# Patient Record
Sex: Female | Born: 1985 | Race: White | Hispanic: No | Smoking: Current every day smoker
Health system: Southern US, Community
[De-identification: ages and names within clinical notes are randomized; demographics above are authoritative.]

## PROBLEM LIST (undated history)

## (undated) DIAGNOSIS — F329 Major depressive disorder, single episode, unspecified: Secondary | ICD-10-CM

## (undated) DIAGNOSIS — IMO0002 Reserved for concepts with insufficient information to code with codable children: Secondary | ICD-10-CM

## (undated) DIAGNOSIS — F32A Depression, unspecified: Secondary | ICD-10-CM

## (undated) DIAGNOSIS — R87619 Unspecified abnormal cytological findings in specimens from cervix uteri: Secondary | ICD-10-CM

## (undated) DIAGNOSIS — Z8619 Personal history of other infectious and parasitic diseases: Secondary | ICD-10-CM

## (undated) DIAGNOSIS — F419 Anxiety disorder, unspecified: Secondary | ICD-10-CM

## (undated) HISTORY — DX: Depression, unspecified: F32.A

## (undated) HISTORY — DX: Reserved for concepts with insufficient information to code with codable children: IMO0002

## (undated) HISTORY — DX: Personal history of other infectious and parasitic diseases: Z86.19

## (undated) HISTORY — DX: Anxiety disorder, unspecified: F41.9

## (undated) HISTORY — PX: WISDOM TOOTH EXTRACTION: SHX21

## (undated) HISTORY — DX: Unspecified abnormal cytological findings in specimens from cervix uteri: R87.619

---
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
## 2008-07-05 ENCOUNTER — Other Ambulatory Visit: Admission: RE | Admit: 2008-07-05 | Discharge: 2008-07-05 | Payer: Self-pay | Admitting: Family Medicine

## 2009-03-06 ENCOUNTER — Emergency Department (HOSPITAL_COMMUNITY): Admission: EM | Admit: 2009-03-06 | Discharge: 2009-03-07 | Payer: Self-pay | Admitting: Emergency Medicine

## 2009-03-08 ENCOUNTER — Emergency Department (HOSPITAL_COMMUNITY): Admission: EM | Admit: 2009-03-08 | Discharge: 2009-03-08 | Payer: Self-pay | Admitting: Emergency Medicine

## 2009-07-22 IMAGING — CR DG HIP (WITH OR WITHOUT PELVIS) 2-3V*L*
2 series · 2 of 2 positions shown · non-contrast
Comparison: None available.

CLINICAL DATA: Fall from horse.  Pain.

LEFT HIP - COMPLETE 2+ VIEW

[t hip ap left]
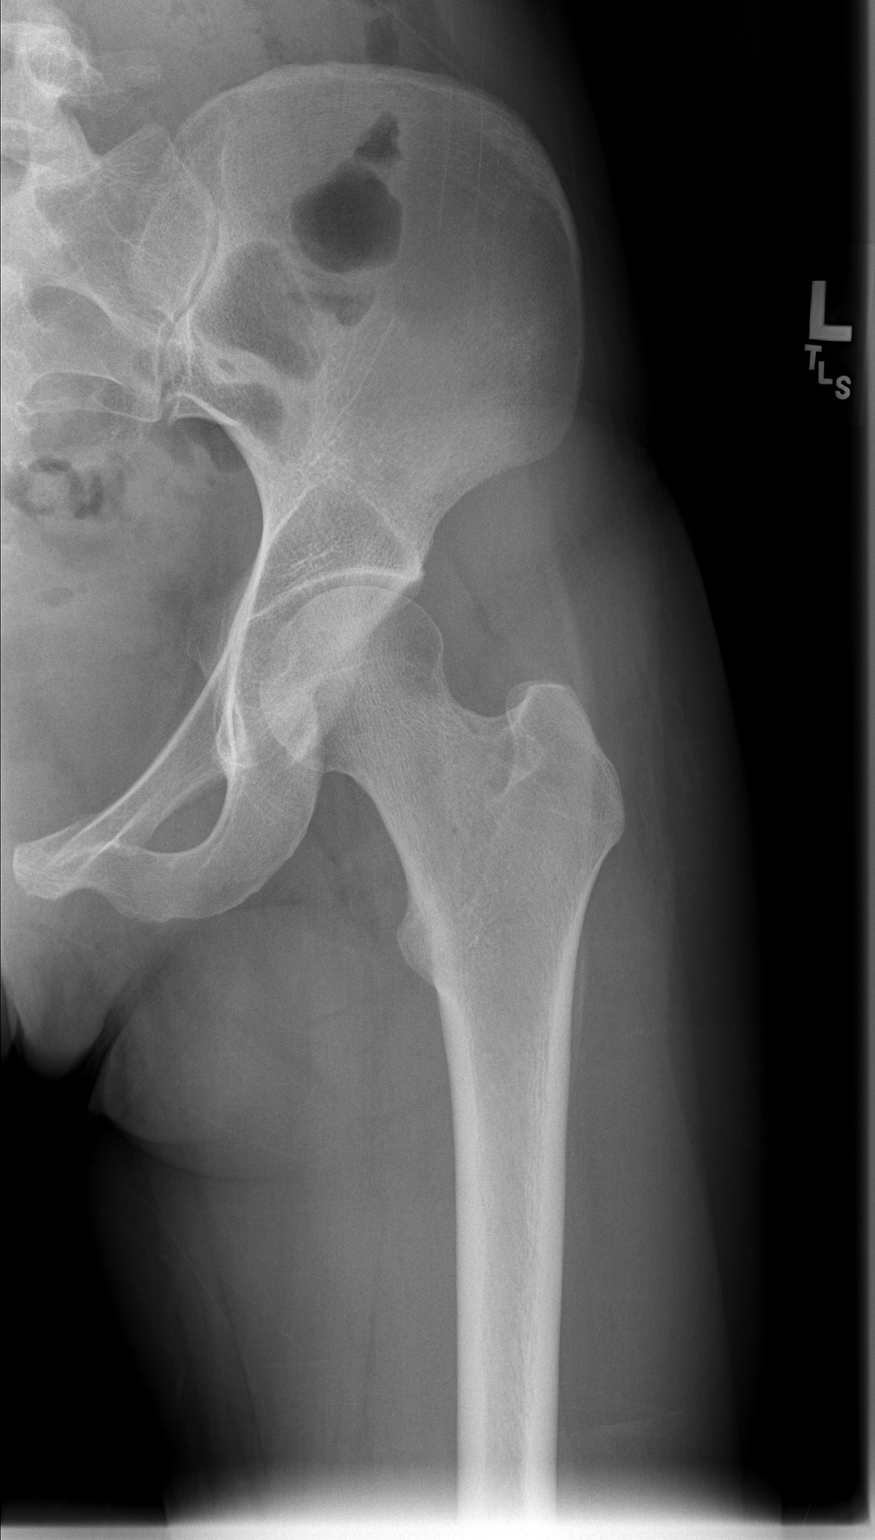

[t hip frog leg left]
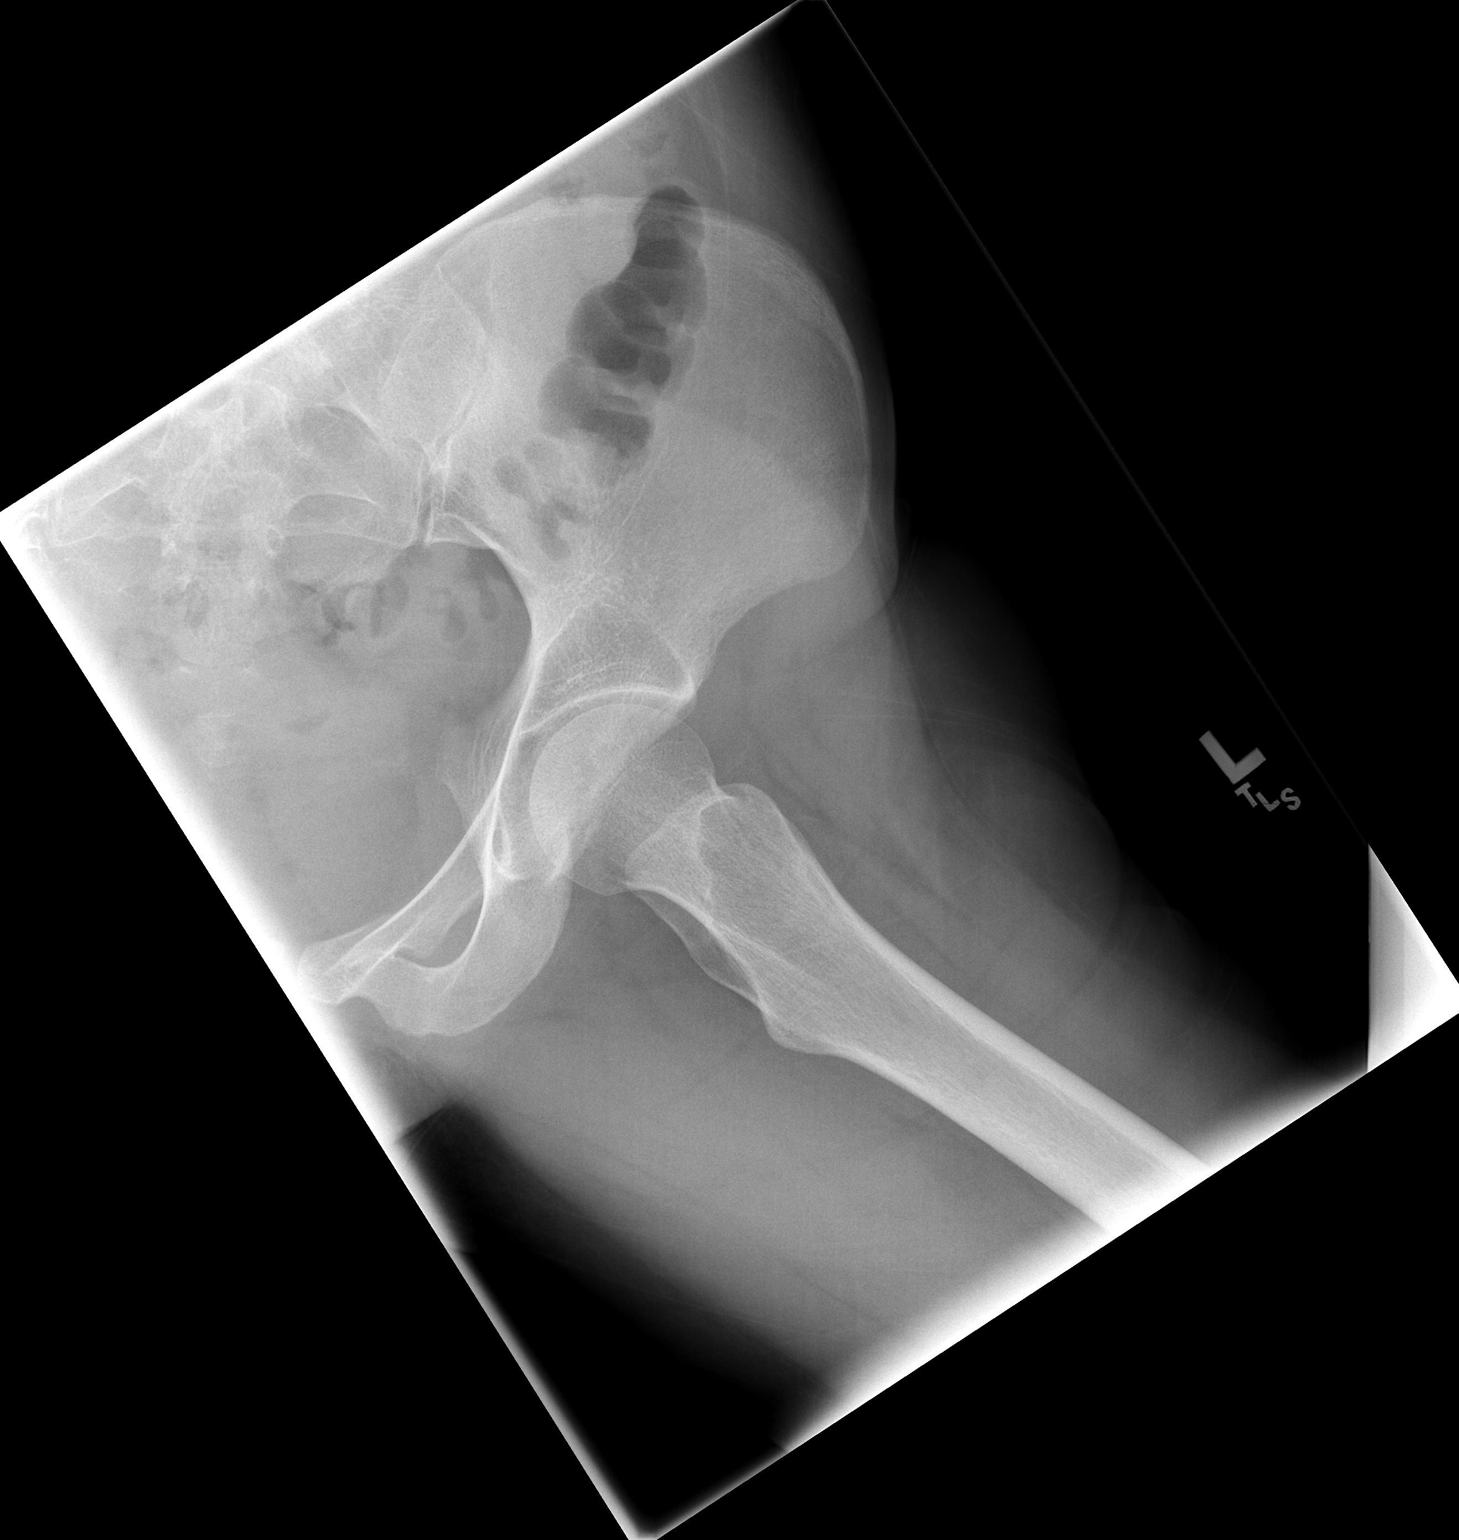

[2 of 2 positions shown; findings below may reference images not displayed]

FINDINGS: The left hip is located.  There is no fracture.  Soft
tissue structures unremarkable.
IMPRESSION: Negative exam.

## 2011-02-27 LAB — URINE MICROSCOPIC-ADD ON

## 2011-02-27 LAB — URINALYSIS, ROUTINE W REFLEX MICROSCOPIC
Bilirubin Urine: NEGATIVE
Glucose, UA: NEGATIVE mg/dL
Hgb urine dipstick: NEGATIVE
Hgb urine dipstick: NEGATIVE
Ketones, ur: NEGATIVE mg/dL
Nitrite: NEGATIVE
Nitrite: NEGATIVE
Protein, ur: NEGATIVE mg/dL
Specific Gravity, Urine: 1.012 (ref 1.005–1.030)
Specific Gravity, Urine: 1.031 — ABNORMAL HIGH (ref 1.005–1.030)
Urobilinogen, UA: 0.2 mg/dL (ref 0.0–1.0)
Urobilinogen, UA: 1 mg/dL (ref 0.0–1.0)
pH: 6.5 (ref 5.0–8.0)

## 2011-02-27 LAB — POCT PREGNANCY, URINE: Preg Test, Ur: NEGATIVE

## 2011-03-01 LAB — HEPATITIS B SURFACE ANTIGEN: Hepatitis B Surface Ag: NEGATIVE

## 2011-03-01 LAB — RUBELLA ANTIBODY, IGM: Rubella: IMMUNE

## 2011-03-01 LAB — ABO/RH: RH Type: POSITIVE

## 2011-04-05 ENCOUNTER — Other Ambulatory Visit (HOSPITAL_COMMUNITY): Payer: Self-pay | Admitting: Obstetrics and Gynecology

## 2011-04-05 DIAGNOSIS — Z3689 Encounter for other specified antenatal screening: Secondary | ICD-10-CM

## 2011-04-08 ENCOUNTER — Ambulatory Visit (HOSPITAL_COMMUNITY)
Admission: RE | Admit: 2011-04-08 | Discharge: 2011-04-08 | Disposition: A | Discharge status: Home / Self Care | Source: Ambulatory Visit | Attending: Obstetrics and Gynecology | Admitting: Obstetrics and Gynecology

## 2011-04-08 ENCOUNTER — Encounter (HOSPITAL_COMMUNITY): Payer: Self-pay

## 2011-04-08 ENCOUNTER — Other Ambulatory Visit (HOSPITAL_COMMUNITY): Payer: Self-pay | Admitting: Obstetrics and Gynecology

## 2011-04-08 DIAGNOSIS — O99330 Smoking (tobacco) complicating pregnancy, unspecified trimester: Secondary | ICD-10-CM | POA: Insufficient documentation

## 2011-04-08 DIAGNOSIS — Z363 Encounter for antenatal screening for malformations: Secondary | ICD-10-CM | POA: Insufficient documentation

## 2011-04-08 DIAGNOSIS — Z3689 Encounter for other specified antenatal screening: Secondary | ICD-10-CM

## 2011-04-08 DIAGNOSIS — O358XX0 Maternal care for other (suspected) fetal abnormality and damage, not applicable or unspecified: Secondary | ICD-10-CM | POA: Insufficient documentation

## 2011-04-08 DIAGNOSIS — Z1389 Encounter for screening for other disorder: Secondary | ICD-10-CM | POA: Insufficient documentation

## 2011-08-01 LAB — STREP B DNA PROBE: GBS: NEGATIVE

## 2011-08-30 ENCOUNTER — Telehealth (HOSPITAL_COMMUNITY): Payer: Self-pay | Admitting: *Deleted

## 2011-08-30 ENCOUNTER — Encounter (HOSPITAL_COMMUNITY): Payer: Self-pay | Admitting: *Deleted

## 2011-08-30 NOTE — Telephone Encounter (Signed)
Preadmission screen  

## 2011-09-02 ENCOUNTER — Other Ambulatory Visit: Payer: Self-pay | Admitting: Obstetrics and Gynecology

## 2011-09-02 ENCOUNTER — Inpatient Hospital Stay (HOSPITAL_COMMUNITY)
Admission: AD | Admit: 2011-09-02 | Discharge: 2011-09-06 | DRG: 765 | Disposition: A | Discharge status: Home / Self Care | Source: Ambulatory Visit | Attending: Obstetrics and Gynecology | Admitting: Obstetrics and Gynecology

## 2011-09-02 DIAGNOSIS — Z98891 History of uterine scar from previous surgery: Secondary | ICD-10-CM

## 2011-09-02 DIAGNOSIS — O324XX0 Maternal care for high head at term, not applicable or unspecified: Secondary | ICD-10-CM | POA: Diagnosis present

## 2011-09-02 NOTE — Progress Notes (Signed)
Pt states the contractions started every 5 min 2 hours ago-has been contracting all day

## 2011-09-03 ENCOUNTER — Inpatient Hospital Stay (HOSPITAL_COMMUNITY): Admitting: Anesthesiology

## 2011-09-03 ENCOUNTER — Other Ambulatory Visit: Payer: Self-pay | Admitting: Obstetrics and Gynecology

## 2011-09-03 ENCOUNTER — Encounter (HOSPITAL_COMMUNITY)
Admission: AD | Disposition: A | Discharge status: Home / Self Care | Payer: Self-pay | Source: Ambulatory Visit | Attending: Obstetrics and Gynecology

## 2011-09-03 ENCOUNTER — Inpatient Hospital Stay (HOSPITAL_COMMUNITY): Admission: RE | Admit: 2011-09-03 | Source: Ambulatory Visit

## 2011-09-03 ENCOUNTER — Encounter (HOSPITAL_COMMUNITY): Payer: Self-pay | Admitting: Anesthesiology

## 2011-09-03 LAB — CBC
MCH: 29.8 pg (ref 26.0–34.0)
MCHC: 33.8 g/dL (ref 30.0–36.0)
MCV: 88.2 fL (ref 78.0–100.0)
Platelets: 214 10*3/uL (ref 150–400)
RDW: 13.3 % (ref 11.5–15.5)

## 2011-09-03 SURGERY — Surgical Case
Anesthesia: Regional

## 2011-09-03 MED ORDER — LACTATED RINGERS IV SOLN
INTRAVENOUS | Status: DC
  Administered 2011-09-03 – 2011-09-04 (×7): via INTRAVENOUS

## 2011-09-03 MED ORDER — ONDANSETRON HCL 4 MG/2ML IJ SOLN
INTRAMUSCULAR | Status: AC
  Filled 2011-09-03: qty 2

## 2011-09-03 MED ORDER — EPHEDRINE 5 MG/ML INJ
10.0000 mg | INTRAVENOUS | Status: DC | PRN
  Filled 2011-09-03 (×2): qty 4

## 2011-09-03 MED ORDER — FENTANYL 2.5 MCG/ML BUPIVACAINE 1/10 % EPIDURAL INFUSION (WH - ANES)
14.0000 mL/h | INTRAMUSCULAR | Status: DC
  Administered 2011-09-03 (×4): 14 mL/h via EPIDURAL
  Filled 2011-09-03 (×4): qty 60

## 2011-09-03 MED ORDER — PHENYLEPHRINE HCL 10 MG/ML IJ SOLN
INTRAMUSCULAR | Status: DC | PRN
  Administered 2011-09-03 (×3): 40 ug via INTRAVENOUS

## 2011-09-03 MED ORDER — LACTATED RINGERS IV SOLN: 500.0000 mL | Freq: Once | INTRAVENOUS | Status: DC

## 2011-09-03 MED ORDER — OXYTOCIN 10 UNIT/ML IJ SOLN
INTRAMUSCULAR | Status: AC
  Filled 2011-09-03: qty 2

## 2011-09-03 MED ORDER — PHENYLEPHRINE 40 MCG/ML (10ML) SYRINGE FOR IV PUSH (FOR BLOOD PRESSURE SUPPORT)
PREFILLED_SYRINGE | INTRAVENOUS | Status: AC
  Filled 2011-09-03: qty 5

## 2011-09-03 MED ORDER — SODIUM BICARBONATE 8.4 % IV SOLN
INTRAVENOUS | Status: DC | PRN
  Administered 2011-09-03: 5 mL via EPIDURAL

## 2011-09-03 MED ORDER — OXYCODONE-ACETAMINOPHEN 5-325 MG PO TABS: 2.0000 | ORAL_TABLET | ORAL | Status: DC | PRN

## 2011-09-03 MED ORDER — DIPHENHYDRAMINE HCL 50 MG/ML IJ SOLN: 12.5000 mg | INTRAMUSCULAR | Status: DC | PRN

## 2011-09-03 MED ORDER — LACTATED RINGERS IV SOLN: 500.0000 mL | INTRAVENOUS | Status: DC | PRN

## 2011-09-03 MED ORDER — OXYTOCIN 20 UNITS IN LACTATED RINGERS INFUSION - SIMPLE: 125.0000 mL/h | Freq: Once | INTRAVENOUS | Status: DC

## 2011-09-03 MED ORDER — FLEET ENEMA 7-19 GM/118ML RE ENEM: 1.0000 | ENEMA | RECTAL | Status: DC | PRN

## 2011-09-03 MED ORDER — EPHEDRINE 5 MG/ML INJ
INTRAVENOUS | Status: AC
  Filled 2011-09-03: qty 10

## 2011-09-03 MED ORDER — ONDANSETRON HCL 4 MG/2ML IJ SOLN
4.0000 mg | Freq: Four times a day (QID) | INTRAMUSCULAR | Status: DC | PRN
  Administered 2011-09-03 (×2): 4 mg via INTRAVENOUS
  Filled 2011-09-03 (×2): qty 2

## 2011-09-03 MED ORDER — OXYTOCIN 20 UNITS IN LACTATED RINGERS INFUSION - SIMPLE
INTRAVENOUS | Status: DC | PRN
  Administered 2011-09-03 – 2011-09-04 (×2): 20 [IU] via INTRAVENOUS

## 2011-09-03 MED ORDER — LIDOCAINE HCL (PF) 1 % IJ SOLN
30.0000 mL | INTRAMUSCULAR | Status: DC | PRN
  Filled 2011-09-03: qty 30

## 2011-09-03 MED ORDER — FENTANYL CITRATE 0.05 MG/ML IJ SOLN
INTRAMUSCULAR | Status: DC | PRN
  Administered 2011-09-03: 100 ug via INTRAVENOUS

## 2011-09-03 MED ORDER — IBUPROFEN 600 MG PO TABS: 600.0000 mg | ORAL_TABLET | Freq: Four times a day (QID) | ORAL | Status: DC | PRN

## 2011-09-03 MED ORDER — EPHEDRINE 5 MG/ML INJ
10.0000 mg | INTRAVENOUS | Status: DC | PRN
  Filled 2011-09-03: qty 4

## 2011-09-03 MED ORDER — SODIUM CHLORIDE 0.9 % IV SOLN
3.0000 g | Freq: Four times a day (QID) | INTRAVENOUS | Status: DC
  Administered 2011-09-03 – 2011-09-05 (×6): 3 g via INTRAVENOUS
  Filled 2011-09-03 (×8): qty 3

## 2011-09-03 MED ORDER — ACETAMINOPHEN 325 MG PO TABS: 650.0000 mg | ORAL_TABLET | ORAL | Status: DC | PRN

## 2011-09-03 MED ORDER — MORPHINE SULFATE 0.5 MG/ML IJ SOLN
INTRAMUSCULAR | Status: AC
  Filled 2011-09-03: qty 10

## 2011-09-03 MED ORDER — ACETAMINOPHEN 500 MG PO TABS
1000.0000 mg | ORAL_TABLET | ORAL | Status: DC | PRN
  Administered 2011-09-03: 1000 mg via ORAL
  Filled 2011-09-03: qty 2

## 2011-09-03 MED ORDER — OXYTOCIN 20 UNITS IN LACTATED RINGERS INFUSION - SIMPLE
1.0000 m[IU]/min | INTRAVENOUS | Status: DC
  Administered 2011-09-03: 2 m[IU]/min via INTRAVENOUS

## 2011-09-03 MED ORDER — CITRIC ACID-SODIUM CITRATE 334-500 MG/5ML PO SOLN
30.0000 mL | ORAL | Status: DC | PRN
  Administered 2011-09-03: 30 mL via ORAL
  Filled 2011-09-03: qty 15

## 2011-09-03 MED ORDER — OXYTOCIN BOLUS FROM INFUSION
500.0000 mL | Freq: Once | INTRAVENOUS | Status: DC
  Filled 2011-09-03: qty 500
  Filled 2011-09-03: qty 1000

## 2011-09-03 MED ORDER — MORPHINE SULFATE (PF) 0.5 MG/ML IJ SOLN
INTRAMUSCULAR | Status: DC | PRN
  Administered 2011-09-03: 2 mg via INTRAVENOUS

## 2011-09-03 MED ORDER — LIDOCAINE HCL 1.5 % IJ SOLN
INTRAMUSCULAR | Status: DC | PRN
  Administered 2011-09-03 (×2): 5 mL via INTRADERMAL

## 2011-09-03 MED ORDER — MORPHINE SULFATE (PF) 0.5 MG/ML IJ SOLN
INTRAMUSCULAR | Status: DC | PRN
  Administered 2011-09-03: 3 mg via EPIDURAL

## 2011-09-03 MED ORDER — PHENYLEPHRINE 40 MCG/ML (10ML) SYRINGE FOR IV PUSH (FOR BLOOD PRESSURE SUPPORT)
80.0000 ug | PREFILLED_SYRINGE | INTRAVENOUS | Status: DC | PRN
  Filled 2011-09-03 (×2): qty 5

## 2011-09-03 MED ORDER — TERBUTALINE SULFATE 1 MG/ML IJ SOLN: 0.2500 mg | Freq: Once | INTRAMUSCULAR | Status: AC | PRN

## 2011-09-03 MED ORDER — FENTANYL CITRATE 0.05 MG/ML IJ SOLN
INTRAMUSCULAR | Status: AC
  Filled 2011-09-03: qty 2

## 2011-09-03 MED ORDER — ONDANSETRON HCL 4 MG/2ML IJ SOLN
INTRAMUSCULAR | Status: DC | PRN
  Administered 2011-09-03: 4 mg via INTRAVENOUS

## 2011-09-03 MED ORDER — PHENYLEPHRINE 40 MCG/ML (10ML) SYRINGE FOR IV PUSH (FOR BLOOD PRESSURE SUPPORT)
80.0000 ug | PREFILLED_SYRINGE | INTRAVENOUS | Status: DC | PRN
  Filled 2011-09-03: qty 5

## 2011-09-03 SURGICAL SUPPLY — 30 items
CHLORAPREP W/TINT 26ML (MISCELLANEOUS) ×2 IMPLANT
CLOTH BEACON ORANGE TIMEOUT ST (SAFETY) ×2 IMPLANT
CONTAINER PREFILL 10% NBF 15ML (MISCELLANEOUS) IMPLANT
DRSG COVADERM 4X10 (GAUZE/BANDAGES/DRESSINGS) ×2 IMPLANT
ELECT REM PT RETURN 9FT ADLT (ELECTROSURGICAL) ×2
ELECTRODE REM PT RTRN 9FT ADLT (ELECTROSURGICAL) ×1 IMPLANT
EXTRACTOR VACUUM KIWI (MISCELLANEOUS) IMPLANT
EXTRACTOR VACUUM M CUP 4 TUBE (SUCTIONS) IMPLANT
GLOVE BIO SURGEON STRL SZ 6.5 (GLOVE) ×4 IMPLANT
GLOVE BIO SURGEON STRL SZ8 (GLOVE) ×2 IMPLANT
GOWN PREVENTION PLUS LG XLONG (DISPOSABLE) ×4 IMPLANT
KIT ABG SYR 3ML LUER SLIP (SYRINGE) IMPLANT
NEEDLE HYPO 25X5/8 SAFETYGLIDE (NEEDLE) ×2 IMPLANT
NS IRRIG 1000ML POUR BTL (IV SOLUTION) ×2 IMPLANT
PACK C SECTION WH (CUSTOM PROCEDURE TRAY) ×2 IMPLANT
RTRCTR C-SECT PINK 25CM LRG (MISCELLANEOUS) ×2 IMPLANT
SLEEVE SCD COMPRESS KNEE MED (MISCELLANEOUS) IMPLANT
STAPLER VISISTAT 35W (STAPLE) IMPLANT
STRIP CLOSURE SKIN 1/2X4 (GAUZE/BANDAGES/DRESSINGS) ×2 IMPLANT
SUT CHROMIC 1 CTX 36 (SUTURE) ×4 IMPLANT
SUT PLAIN 0 NONE (SUTURE) IMPLANT
SUT PLAIN 2 0 XLH (SUTURE) IMPLANT
SUT VIC AB 0 CT1 27 (SUTURE) ×2
SUT VIC AB 0 CT1 27XBRD ANBCTR (SUTURE) ×2 IMPLANT
SUT VIC AB 2-0 CT1 27 (SUTURE) ×1
SUT VIC AB 2-0 CT1 TAPERPNT 27 (SUTURE) ×1 IMPLANT
SUT VIC AB 4-0 KS 27 (SUTURE) ×2 IMPLANT
TOWEL OR 17X24 6PK STRL BLUE (TOWEL DISPOSABLE) ×4 IMPLANT
TRAY FOLEY CATH 14FR (SET/KITS/TRAYS/PACK) IMPLANT
WATER STERILE IRR 1000ML POUR (IV SOLUTION) ×2 IMPLANT

## 2011-09-03 NOTE — Progress Notes (Signed)
   Subjective: Pt still comfortable  Objective: BP 119/79  Pulse 105  Temp(Src) 100.1 F (37.8 C) (Axillary)  Resp 20  Ht 5\' 7"  (1.702 m)  Wt 70.761 kg (156 lb)  BMI 24.43 kg/m2  SpO2 99%  LMP 11/29/2010      FHT:  FHR: 180 bpm, variability: moderate,  accelerations:  Present,  decelerations:  Absent UC:   regular, every 1-2 minutes SVE:c/c/+1 Pt now with temp to 102, FHR TACHYCARDIC TO 180 BUT GOOD VARIABILITY.  Will give tylenol 1 gram and start unasyn 3grams IV q 6 hours.  Has been pushing 30 minutes again.  Assessment / Plan: [name] 09/03/2011, 9:15 PM

## 2011-09-03 NOTE — Progress Notes (Signed)
[name] is a 25 y.o. G1P0 at [redacted]w[redacted]d  Subjective: Having intermittent N/V  Objective: BP 120/78  Pulse 78  Temp(Src) 98.5 F (36.9 C) (Oral)  Resp 20  Ht 5\' 7"  (1.702 m)  Wt 70.761 kg (156 lb)  BMI 24.43 kg/m2  SpO2 99%  LMP 11/29/2010      FHT:  FHR: 130 bpm, variability: moderate,  accelerations:  Present,  decelerations:  Present mild early decels UC:   regular, every 2 minutes SVE:   Dilation: Lip/rim Effacement (%): 80 Station: 0 Exam by:: Clearence Cheek RN  Labs: Lab Results  Component Value Date   WBC 11.0* 09/03/2011   HGB 11.6* 09/03/2011   HCT 34.3* 09/03/2011   MCV 88.2 09/03/2011   PLT 214 09/03/2011    Assessment / Plan: Feels slightly asynclitic, will try exaggerated Sims position Wonder Donaway W 09/03/2011, 5:36 PM

## 2011-09-03 NOTE — Anesthesia Procedure Notes (Signed)
Epidural Patient location during procedure: OB Start time: 09/03/2011 9:22 AM  Staffing Performed by: anesthesiologist   Preanesthetic Checklist Completed: patient identified, site marked, surgical consent, pre-op evaluation, timeout performed, IV checked, risks and benefits discussed and monitors and equipment checked  Epidural Patient position: sitting Prep: site prepped and draped and DuraPrep Patient monitoring: continuous pulse ox and blood pressure Approach: midline Injection technique: LOR air and LOR saline  Needle:  Needle type: Tuohy  Needle gauge: 17 G Needle length: 9 cm Needle insertion depth: 5 cm cm Catheter type: closed end flexible Catheter size: 19 Gauge Catheter at skin depth: 10 cm Test dose: negative  Assessment Events: blood not aspirated, injection not painful, no injection resistance, negative IV test and no paresthesia  Additional Notes Patient identified.  Risk benefits discussed including failed block, incomplete pain control, headache, nerve damage, paralysis, blood pressure changes, nausea, vomiting, reactions to medication both toxic or allergic, and postpartum back pain.  Patient expressed understanding and wished to proceed.  All questions were answered.  Sterile technique used throughout procedure and epidural site dressed with sterile barrier dressing. No paresthesia or other complications noted.The patient did not experience any signs of intravascular injection such as tinnitus or metallic taste in mouth nor signs of intrathecal spread such as rapid motor block. Please see nursing notes for vital signs.

## 2011-09-03 NOTE — Progress Notes (Signed)
    Pt still comfortable, feels pressure with ctx  Pt has been pushing with good effort for about 2 hours and has only progressed vertex to a +1 station.  D/w her slow progress and recommend proceeding with c-section.   Discussed risks and benefits as well as option of continued pushing.  FHR now at baseline 160's with good variability and temp decreasing with tylenol, but not resolved.  Pt and husband desire to proceed with C-Section. OR notified.  WIll go when room available.  Pt has received Unasyn.  Oliver Pila 09/03/2011, 10:34 PM

## 2011-09-03 NOTE — Progress Notes (Signed)
SIDONIA NUTTER is a 25 y.o. G1P0 at [redacted]w[redacted]d   Subjective: Pt comfortable with epidural  Objective: BP 124/78  Pulse 111  Temp(Src) 98.6 F (37 C) (Oral)  Resp 20  Ht 5\' 7"  (1.702 m)  Wt 70.761 kg (156 lb)  BMI 24.43 kg/m2  SpO2 99%  LMP 11/29/2010      FHT:  FHR: 130 bpm, variability: moderate,  accelerations:  Present,  decelerations:  Absent UC:   regular, every 2-3 minutes SVE:   Dilation: 6.5 Effacement (%): 80 Station: -1 Exam by:: Dr. Senaida Ores  Labs: Lab Results  Component Value Date   WBC 11.0* 09/03/2011   HGB 11.6* 09/03/2011   HCT 34.3* 09/03/2011   MCV 88.2 09/03/2011   PLT 214 09/03/2011    Assessment / Plan: Augmentation of labor, progressing well   Zakiya Sporrer W 09/03/2011, 12:24 PM

## 2011-09-03 NOTE — Anesthesia Preprocedure Evaluation (Addendum)
Anesthesia Evaluation  Name, MR# and DOB Patient awake  General Assessment Comment  Reviewed: Allergy & Precautions, H&P , Patient's Chart, lab work & pertinent test results  Airway Mallampati: II TM Distance: >3 FB Neck ROM: full    Dental No notable dental hx.    Pulmonary  clear to auscultation  Pulmonary exam normal       Cardiovascular regular Normal    Neuro/Psych Negative Neurological ROS  Negative Psych ROS   GI/Hepatic negative GI ROS Neg liver ROS    Endo/Other  Negative Endocrine ROS  Renal/GU negative Renal ROS     Musculoskeletal   Abdominal   Peds  Hematology negative hematology ROS (+)   Anesthesia Other Findings   Reproductive/Obstetrics (+) Pregnancy                           Anesthesia Physical Anesthesia Plan  ASA: II and Emergent  Anesthesia Plan: Epidural   Post-op Pain Management:    Induction:   Airway Management Planned:   Additional Equipment:   Intra-op Plan:   Post-operative Plan:   Informed Consent: I have reviewed the patients History and Physical, chart, labs and discussed the procedure including the risks, benefits and alternatives for the proposed anesthesia with the patient or authorized representative who has indicated his/her understanding and acceptance.     Plan Discussed with: Anesthesiologist, CRNA and Surgeon  Anesthesia Plan Comments:        Anesthesia Quick Evaluation

## 2011-09-03 NOTE — H&P (Signed)
[name] is a 25 y.o. G1P0 at 72 1/7 weeks (EDD 09/02/11 by 10 week [place])  female presenting for contractions at about 1-2 AM and was scheduled for induction anyway, so kept overnight, but really did not progress any, so started on Pitocin to augment labor.  Prenatal care significant only for h/o anxiety/fibromyalgia but has been stable this pregnancy on minimal meds.  (occasional flexeril and klonipin)  No other significant issues.  Maternal Medical History:  Reason for admission: Reason for admission: contractions.  Contractions: Onset was 6-12 hours ago.   Frequency: irregular.   Perceived severity is moderate.    Fetal activity: Perceived fetal activity is normal.      OB History    Grav Para Term Preterm Abortions TAB SAB Ect Mult Living   1              Past Medical History  Diagnosis Date  . Abnormal Pap smear   . Anxiety   . History of HPV infection    Past Surgical History  Procedure Date  . Laser ablation of the cervix 2004   Family History: family history includes Diabetes in her maternal grandfather and maternal grandmother and Hypertension in her maternal grandmother.  There is no history of Anesthesia problems, and Hypotension, and Malignant hyperthermia, and Pseudochol deficiency, . Social History:  reports that she quit smoking about 7 months ago. She does not have any smokeless tobacco history on file. She reports that she does not drink alcohol or use illicit drugs.  ROS  Dilation: 4 Effacement (%): 80 Station: -1 Exam by:: Dr. Senaida Ores AROM slightly bloody Blood pressure 125/78, pulse 91, temperature 98.4 F (36.9 C), temperature source Oral, resp. rate 20, height 5\' 7"  (1.702 m), weight 70.761 kg (156 lb), last menstrual period 11/29/2010. Maternal Exam:  Uterine Assessment: Contraction strength is moderate.  Abdomen: Patient reports no abdominal tenderness. Fetal presentation: vertex  Introitus: Normal vulva. Normal vagina.    Physical Exam    Constitutional: She is oriented to person, place, and time. She appears well-developed.  Cardiovascular: Normal rate and regular rhythm.   Respiratory: Effort normal and breath sounds normal.  GI: Soft. Bowel sounds are normal.  Genitourinary: Vagina normal and uterus normal.  Neurological: She is alert and oriented to person, place, and time.  Psychiatric: She has a normal mood and affect. Her behavior is normal.    Prenatal labs: ABO, Rh: B/Positive/-- (04/13 0000) Antibody: Negative (04/13 0000) Rubella: Immune (04/13 0000) RPR: NON REACTIVE (10/16 0240)  HBsAg: Negative (04/13 0000)  HIV: Non-reactive (04/13 0000)  GBS: Negative (09/13 0000)  One hour glucola 76 First trimester screen nml AFP WNL Pap LGSIL colpo WNL repeat pp  Assessment/Plan: Pt getting uncomfortable and requesting epidural, will allow.  Pitocin per protocol.   Oliver Pila 09/03/2011, 8:49 AM

## 2011-09-03 NOTE — Progress Notes (Signed)
   Subjective: Pt very comfortable  Objective: BP 122/105  Pulse 151  Temp(Src) 99.7 F (37.6 C) (Oral)  Resp 20  Ht 5\' 7"  (1.702 m)  Wt 70.761 kg (156 lb)  BMI 24.43 kg/m2  SpO2 99%  LMP 11/29/2010      FHT:  FHR: 125 bpm, variability: moderate,  accelerations:  Present,  decelerations:  Absent UC:   regular, every 2-3 minutes SVE:   Dilation: 10 Effacement (%): 100 Station: 0 Exam by:: Dr Senaida Ores Pt attempted pushing for about 30 minutes and made slow progress.  Feels nothing.  Will wait about 1 hour and then resume unless pt becomes uncomfortable prior.  [name] 09/03/2011, 7:58 PM

## 2011-09-04 ENCOUNTER — Encounter (HOSPITAL_COMMUNITY): Payer: Self-pay | Admitting: *Deleted

## 2011-09-04 MED ORDER — NALBUPHINE HCL 10 MG/ML IJ SOLN
5.0000 mg | INTRAMUSCULAR | Status: DC | PRN
Start: 2011-09-04 — End: 2011-09-06
  Filled 2011-09-04: qty 1

## 2011-09-04 MED ORDER — DIPHENHYDRAMINE HCL 50 MG/ML IJ SOLN: 25.0000 mg | INTRAMUSCULAR | Status: DC | PRN

## 2011-09-04 MED ORDER — MEPERIDINE HCL 25 MG/ML IJ SOLN
INTRAMUSCULAR | Status: AC
  Filled 2011-09-04: qty 1

## 2011-09-04 MED ORDER — HYDROMORPHONE HCL 1 MG/ML IJ SOLN: 0.5000 mg | INTRAMUSCULAR | Status: DC | PRN

## 2011-09-04 MED ORDER — ZOLPIDEM TARTRATE 5 MG PO TABS: 5.0000 mg | ORAL_TABLET | Freq: Every evening | ORAL | Status: DC | PRN

## 2011-09-04 MED ORDER — KETOROLAC TROMETHAMINE 30 MG/ML IJ SOLN
INTRAMUSCULAR | Status: AC
  Filled 2011-09-04: qty 1

## 2011-09-04 MED ORDER — DIPHENHYDRAMINE HCL 25 MG PO CAPS: 25.0000 mg | ORAL_CAPSULE | Freq: Four times a day (QID) | ORAL | Status: DC | PRN

## 2011-09-04 MED ORDER — SCOPOLAMINE 1 MG/3DAYS TD PT72
MEDICATED_PATCH | TRANSDERMAL | Status: AC
  Filled 2011-09-04: qty 1

## 2011-09-04 MED ORDER — SODIUM CHLORIDE 0.9 % IJ SOLN
3.0000 mL | INTRAMUSCULAR | Status: DC | PRN
  Administered 2011-09-04: 3 mL via INTRAVENOUS

## 2011-09-04 MED ORDER — IBUPROFEN 600 MG PO TABS
600.0000 mg | ORAL_TABLET | Freq: Four times a day (QID) | ORAL | Status: DC
  Administered 2011-09-04 – 2011-09-06 (×8): 600 mg via ORAL
  Filled 2011-09-04 (×5): qty 1

## 2011-09-04 MED ORDER — DIBUCAINE 1 % RE OINT: 1.0000 "application " | TOPICAL_OINTMENT | RECTAL | Status: DC | PRN

## 2011-09-04 MED ORDER — KETOROLAC TROMETHAMINE 30 MG/ML IJ SOLN
30.0000 mg | Freq: Four times a day (QID) | INTRAMUSCULAR | Status: AC | PRN
  Administered 2011-09-04 (×2): 30 mg via INTRAVENOUS
  Filled 2011-09-04: qty 1

## 2011-09-04 MED ORDER — METOCLOPRAMIDE HCL 5 MG/ML IJ SOLN: 10.0000 mg | Freq: Three times a day (TID) | INTRAMUSCULAR | Status: DC | PRN

## 2011-09-04 MED ORDER — ONDANSETRON HCL 4 MG PO TABS: 4.0000 mg | ORAL_TABLET | ORAL | Status: DC | PRN

## 2011-09-04 MED ORDER — DIPHENHYDRAMINE HCL 25 MG PO CAPS: 25.0000 mg | ORAL_CAPSULE | ORAL | Status: DC | PRN

## 2011-09-04 MED ORDER — NALBUPHINE HCL 10 MG/ML IJ SOLN
5.0000 mg | INTRAMUSCULAR | Status: DC | PRN
  Filled 2011-09-04: qty 1

## 2011-09-04 MED ORDER — SCOPOLAMINE 1 MG/3DAYS TD PT72
1.0000 | MEDICATED_PATCH | Freq: Once | TRANSDERMAL | Status: DC
  Administered 2011-09-04: 1.5 mg via TRANSDERMAL
  Filled 2011-09-04: qty 1

## 2011-09-04 MED ORDER — MEPERIDINE HCL 25 MG/ML IJ SOLN
INTRAMUSCULAR | Status: DC | PRN
  Administered 2011-09-04: 25 mg via INTRAVENOUS

## 2011-09-04 MED ORDER — SODIUM CHLORIDE 0.9 % IV SOLN
1.0000 ug/kg/h | INTRAVENOUS | Status: DC | PRN
  Filled 2011-09-04: qty 2.5

## 2011-09-04 MED ORDER — ONDANSETRON HCL 4 MG/2ML IJ SOLN: 4.0000 mg | INTRAMUSCULAR | Status: DC | PRN

## 2011-09-04 MED ORDER — KETOROLAC TROMETHAMINE 30 MG/ML IJ SOLN: 30.0000 mg | Freq: Four times a day (QID) | INTRAMUSCULAR | Status: AC | PRN

## 2011-09-04 MED ORDER — HYDROMORPHONE HCL 1 MG/ML IJ SOLN
INTRAMUSCULAR | Status: AC
  Filled 2011-09-04: qty 1

## 2011-09-04 MED ORDER — WITCH HAZEL-GLYCERIN EX PADS: 1.0000 "application " | MEDICATED_PAD | CUTANEOUS | Status: DC | PRN

## 2011-09-04 MED ORDER — SIMETHICONE 80 MG PO CHEW
80.0000 mg | CHEWABLE_TABLET | Freq: Three times a day (TID) | ORAL | Status: DC
  Administered 2011-09-04 – 2011-09-05 (×6): 80 mg via ORAL

## 2011-09-04 MED ORDER — DIPHENHYDRAMINE HCL 25 MG PO CAPS
ORAL_CAPSULE | ORAL | Status: AC
  Filled 2011-09-04: qty 1

## 2011-09-04 MED ORDER — MAGNESIUM HYDROXIDE 400 MG/5ML PO SUSP: 30.0000 mL | ORAL | Status: DC | PRN

## 2011-09-04 MED ORDER — NALOXONE HCL 0.4 MG/ML IJ SOLN: 0.4000 mg | INTRAMUSCULAR | Status: DC | PRN

## 2011-09-04 MED ORDER — LACTATED RINGERS IV SOLN: INTRAVENOUS | Status: DC

## 2011-09-04 MED ORDER — ONDANSETRON HCL 4 MG/2ML IJ SOLN: 4.0000 mg | Freq: Three times a day (TID) | INTRAMUSCULAR | Status: DC | PRN

## 2011-09-04 MED ORDER — DIPHENHYDRAMINE HCL 50 MG/ML IJ SOLN: 12.5000 mg | INTRAMUSCULAR | Status: DC | PRN

## 2011-09-04 MED ORDER — OXYCODONE-ACETAMINOPHEN 5-325 MG PO TABS
1.0000 | ORAL_TABLET | ORAL | Status: DC | PRN
  Administered 2011-09-04 – 2011-09-06 (×5): 1 via ORAL
  Administered 2011-09-06: 2 via ORAL
  Filled 2011-09-04 (×5): qty 1
  Filled 2011-09-04: qty 2
  Filled 2011-09-04: qty 1

## 2011-09-04 MED ORDER — SENNOSIDES-DOCUSATE SODIUM 8.6-50 MG PO TABS
2.0000 | ORAL_TABLET | Freq: Every day | ORAL | Status: DC
  Administered 2011-09-04 – 2011-09-05 (×2): 2 via ORAL

## 2011-09-04 MED ORDER — SIMETHICONE 80 MG PO CHEW: 80.0000 mg | CHEWABLE_TABLET | ORAL | Status: DC | PRN

## 2011-09-04 NOTE — Progress Notes (Signed)

## 2011-09-04 NOTE — Anesthesia Postprocedure Evaluation (Signed)
  Anesthesia Post-op Note  Patient: [name]  Procedure(s) Performed:  CESAREAN SECTION  Patient Location: PACU and Mother/Baby  Anesthesia Type: Epidural  Level of Consciousness: awake, alert  and oriented  Airway and Oxygen Therapy: Patient Spontanous Breathing  Post-op Pain: none  Post-op Assessment: Post-op Vital signs reviewed  Post-op Vital Signs: Reviewed and stable  Complications: No apparent anesthesia complications

## 2011-09-04 NOTE — Addendum Note (Signed)
Addendum  created 09/04/11 0981 by Isabella Bowens   Modules edited:Notes Section

## 2011-09-04 NOTE — Progress Notes (Signed)
Subjective: Postpartum Day 1: Cesarean Delivery Patient reports tolerating PO, pain controlled  Objective: Vital signs in last 24 hours: Temp:  [98.1 F (36.7 C)-102.1 F (38.9 C)] 98.3 F (36.8 C) (10/17 0623) Pulse Rate:  [63-168] 71  (10/17 0623) Resp:  [16-22] 18  (10/17 0623) BP: (99-149)/(47-114) 121/74 mmHg (10/17 0623) SpO2:  [81 %-100 %] 100 % (10/17 0700)  Physical Exam:  General: alert [name]: appropriate Uterine Fundus: firm Incision: C/D/I }   Basename 09/03/11 0240  HGB 11.6*  HCT 34.3*    Assessment/Plan: Status post Cesarean section. Fever defervescing, will continue Unasyn for 24 hours.  Continue current care.  Oliver Pila 09/04/2011, 8:50 AM

## 2011-09-04 NOTE — Transfer of Care (Signed)
Immediate Anesthesia Transfer of Care Note  Patient: [name]  Procedure(s) Performed:  CESAREAN SECTION  Patient Location: PACU  Anesthesia Type: Epidural  Level of Consciousness: awake, alert  and oriented  Airway & Oxygen Therapy: Patient Spontanous Breathing  Post-op Assessment: Report given to PACU RN and Post -op Vital signs reviewed and stable  Post vital signs: Reviewed and stable  Complications: No apparent anesthesia complications

## 2011-09-04 NOTE — Op Note (Signed)
[name], [name] NO.:  [id number]  MEDICAL RECORD NO.:  [id number]  LOCATION:  [facility]                          FACILITY:  [facility]  PHYSICIAN:  Huel Cote, M.D. DATE OF BIRTH:  04-23-1986  DATE OF PROCEDURE: DATE OF DISCHARGE:                              OPERATIVE REPORT   PREOPERATIVE DIAGNOSES:  Arrest of descent, term pregnancy at 40+ weeks, and meconium-stained fluid.  POSTOPERATIVE DIAGNOSES:  Arrest of descent, term pregnancy at 40+ weeks, and meconium-stained fluid.  PROCEDURE:  Primary low-transverse C-section with two-layer closure of uterus.  SURGEON:  Huel Cote, MD  ANESTHESIA:  Epidural.  ESTIMATED BLOOD LOSS:  600 mL.  URINE OUTPUT:  300 mL blood-tinged urine.  IV FLUIDS:  1400 mL LR.  FINDINGS:  There was a viable female infant in the vertex presentation, Apgars were 4 and 8, weight was 8 pounds 1 ounce.  There was terminal meconium noted that was thick after delivery of the infant.  NICU was present to assess the baby and did note that there was no meconium below the vocal cords.  Uterus, tubes, and ovaries were normal.  SPECIMEN:  Placenta was sent to Pathology.  PROCEDURE IN DETAIL:  After informed consent was obtained, the patient was taken to the operating room where epidural anesthesia was found to be adequate by Allis clamp test.  She was then prepped and draped in the normal sterile fashion in the dorsal supine position with a leftward tilt.  An appropriate time-out was performed and a Pfannenstiel skin incision was then made with a scalpel and carried through to the underlying layer of fascia by sharp dissection and Bovie cautery.  The fascia was then nicked in the midline and the incision was extended laterally.  The inferior aspect of the incision was grasped with Kocher clamps, elevated, and dissected off underlying rectus muscles.  In a similar fashion, the superior aspect was dissected off the rectus muscles.   These were separated in the midline and the peritoneal cavity entered bluntly.  The peritoneal incision was then extended both superiorly and inferiorly with careful attention to avoid both bowel and bladder.  The Alexis self-retaining wound retractor was then placed within the incision and the lower uterine segment was exposed.  The bladder flap was created and the lower uterine segment was then incised in a transverse fashion.  The cavity itself was entered bluntly with thick meconium-stained fluid noted.  The infant's head was then elevated out of the pelvis and delivered atraumatically.  Nose and mouth were bulb suctioned, and the remainder of the infant delivered without difficulty.  The infant's cord was clamped and cut and handed off to the awaiting pediatricians to assess the baby.  The placenta was then expressed spontaneously after cord blood donation was performed.  The uterus was cleared of all clots and debris and noted to be slightly boggy, therefore the Pitocin bolus was increased.  This responded well to massage and the uterine incision was then closed in 2 layers, the first a running locked layer of 1-0 chromic and the second an imbricating layer of the same suture.  Good hemostasis was noted.  The pelvis and abdomen were irrigated  with no areas of active bleeding noted.  The bladder flap was inspected and no obvious trauma noted.  The instruments and sponges were then removed from the patient's abdomen and the peritoneum was reapproximated with several interrupted mattress sutures of 2-0 Vicryl.  The fascia was closed with 0 Vicryl in a running fashion and the skin was closed with 3-0 Vicryl in a subcuticular stitch on a Keith needle.  Benzoin and Steri-Strips were placed.  Again all instruments, sponges were removed from the abdomen and counts were correct and the patient was taken to the recovery room in good condition.     Huel Cote, M.D.     KR/MEDQ   D:  09/04/2011  T:  09/04/2011  Job:  [id number]

## 2011-09-04 NOTE — Anesthesia Postprocedure Evaluation (Signed)
  Anesthesia Post-op Note  Patient: [name]  Procedure(s) Performed:  CESAREAN SECTION  Patient Location: PACU  Anesthesia Type: Epidural  Level of Consciousness: awake, alert  and oriented  Airway and Oxygen Therapy: Patient Spontanous Breathing  Post-op Pain: mild  Post-op Assessment: Post-op Vital signs reviewed, Patient's Cardiovascular Status Stable, Respiratory Function Stable, Patent Airway, No signs of Nausea or vomiting, Pain level controlled, No headache and No backache  Post-op Vital Signs: Reviewed and stable  Complications: No apparent anesthesia complications

## 2011-09-05 LAB — CBC
HCT: 28.4 % — ABNORMAL LOW (ref 36.0–46.0)
Hemoglobin: 9.4 g/dL — ABNORMAL LOW (ref 12.0–15.0)
RBC: 3.15 MIL/uL — ABNORMAL LOW (ref 3.87–5.11)

## 2011-09-05 MED ORDER — FENTANYL CITRATE 0.05 MG/ML IJ SOLN: 25.0000 ug | INTRAMUSCULAR | Status: DC | PRN

## 2011-09-05 MED ORDER — METOCLOPRAMIDE HCL 5 MG/ML IJ SOLN: 10.0000 mg | Freq: Once | INTRAMUSCULAR | Status: AC | PRN

## 2011-09-05 MED ORDER — MEPERIDINE HCL 25 MG/ML IJ SOLN: 6.2500 mg | INTRAMUSCULAR | Status: DC | PRN

## 2011-09-05 MED ORDER — IBUPROFEN 600 MG PO TABS
600.0000 mg | ORAL_TABLET | Freq: Four times a day (QID) | ORAL | Status: DC | PRN
  Filled 2011-09-05 (×4): qty 1

## 2011-09-05 NOTE — Progress Notes (Signed)
Subjective: Postpartum Day 2: Cesarean Delivery Patient reports tolerating PO and no problems voiding.    Objective: Vital signs in last 24 hours: Temp:  [97.8 F (36.6 C)-98.3 F (36.8 C)] 97.8 F (36.6 C) (10/18 0815) Pulse Rate:  [66-92] 78  (10/18 0508) Resp:  [16-20] 18  (10/18 0508) BP: (104-125)/(66-74) 104/69 mmHg (10/18 0508) SpO2:  [98 %-100 %] 100 % (10/17 2231)  Physical Exam:  General: alert [name]: appropriate Uterine Fundus: firm Incision: healing well    Basename 09/03/11 0240  HGB 11.6*  HCT 34.3*    Assessment/Plan: Status post Cesarean section. Doing well postoperatively.  No temp in 24 hours, will d/c unasyn. Continue current care. D/w pt and husband circumcision in detail, risks and benfits reviewed--desire me to proceed. [name] 09/05/2011, 8:47 AM

## 2011-09-06 ENCOUNTER — Encounter (HOSPITAL_COMMUNITY): Payer: Self-pay | Admitting: Obstetrics and Gynecology

## 2011-09-06 MED ORDER — IBUPROFEN 600 MG PO TABS: 600.0000 mg | ORAL_TABLET | Freq: Four times a day (QID) | ORAL | Status: AC | PRN

## 2011-09-06 MED ORDER — OXYCODONE-ACETAMINOPHEN 5-325 MG PO TABS: 1.0000 | ORAL_TABLET | ORAL | Status: AC | PRN

## 2011-09-06 NOTE — Discharge Summary (Signed)
Obstetric Discharge Summary Reason for Admission: onset of labor Prenatal Procedures: none Intrapartum Procedures: cesarean: low cervical, transverse Postpartum Procedures: none Complications-Operative and Postpartum: none Hemoglobin  Date Value Range Status  09/05/2011 9.4* 12.0-15.0 (g/dL) Final     HCT  Date Value Range Status  09/05/2011 28.4* 36.0-46.0 (%) Final    Discharge Diagnoses: Term Pregnancy-delivered  Discharge Information: Date: 09/06/2011 Activity: pelvic rest Diet: routine Medications: Ibuprofen and Percocet Condition: stable Instructions: refer to practice specific booklet Discharge to: home   Newborn Data: Live born female  Birth Weight: 8 lb 1.8 oz (3680 g) APGAR: 4, 8  Home with mother.  [name] 09/06/2011, 10:35 AM

## 2011-09-06 NOTE — Progress Notes (Signed)
Subjective: [name] reports tolerating PO and no problems voiding.  She has some blurry vision in her right eye only, but no other c/o  Objective: Vital signs in last 24 hours: Temp:  [98 F (36.7 C)-98.4 F (36.9 C)] 98 F (36.7 C) (10/19 0500) Pulse Rate:  [59-64] 59  (10/19 0500) Resp:  [18] 18  (10/19 0500) BP: (113-120)/(72-76) 113/72 mmHg (10/19 0500) SpO2:  [98 %] 98 % (10/18 2115)  Physical Exam:  General: alert [name]: appropriate Uterine Fundus: firm Incision: healing well    Basename 09/05/11 0900  HGB 9.4*  HCT 28.4*    Assessment/Plan: Status post Cesarean section. Doing well postoperatively.  Discharge home with standard precautions and return to office in 2 weeks for incision check.  [name] 09/06/2011, 10:33 AM

## 2013-11-18 HISTORY — PX: BREAST ENHANCEMENT SURGERY: SHX7

## 2014-03-31 ENCOUNTER — Encounter (HOSPITAL_COMMUNITY): Payer: Self-pay | Admitting: Pharmacist

## 2014-04-03 NOTE — H&P (Signed)
[name] is an 28 y.o. female G2P1001 at 9 weeks by LMP with an unfortunate finding of a missed abortion on her OB workup last week.  Pt had an LMP of 01/21/14 and a positive UPT on 02/21/14.  She came for her initial US on 03/28/14 and was found to have a failed pregnancy with 2 gestational sacs.  One was empty and the other had a 7 week fetal pole with no heartbeat. She had had no siginificant bleeding or cramping.  She initially thought she would try cytotec, but changed her mind and has decided to have a D&E.  Pertinent Gynecological History: OB History: C/S 2012 8#1oz   Menstrual History:  No LMP recorded.    Past Medical History  Diagnosis Date  . Abnormal Pap smear   . Anxiety   . History of HPV infection     Past Surgical History  Procedure Laterality Date  . Laser ablation of the cervix  2004  . Cesarean section  09/03/2011    Procedure: CESAREAN SECTION;  Surgeon: Oliver PilaKathy W Hilberto Burzynski;  Location: [facility];  Service: Gynecology;  Laterality: N/A;    Family History  Problem Relation Age of Onset  . Anesthesia problems Neg Hx   . Hypotension Neg Hx   . Malignant hyperthermia Neg Hx   . Pseudochol deficiency Neg Hx   . Diabetes Maternal Grandmother   . Hypertension Maternal Grandmother   . Diabetes Maternal Grandfather     Social History:  reports that she quit smoking about 3 years ago. She does not have any smokeless tobacco history on file. She reports that she does not drink alcohol or use illicit drugs.  Allergies: No Known Allergies  No prescriptions prior to admission    ROS  unknown if currently breastfeeding. Physical Exam  Constitutional: She is oriented to person, place, and time. She appears well-developed and well-nourished.  Cardiovascular: Normal rate and regular rhythm.   Respiratory: Effort normal.  GI: Soft.  Genitourinary: Vagina normal and uterus normal.  Neurological: She is alert and oriented to person, place, and time.  Psychiatric: She  has a normal mood and affect.    No results found for this or any previous visit (from the past 24 hour(s)).  No results found.  Assessment/Plan: The findings of missed abortion were discussed with the patient in detail. We discussed her options going forward with expectant management, cytotec induced bleeding, and D&C. The risks and benefits of each approach were reviewed and the patient initially elected to try cytotec.  She the changed her mind and decided she would like a D&E.  THe risks of D&E were discussed with the patient and the procedure reviewed thoroughly.  We discussed risks of bleeding and uterine perforation.  Her blood type is B positive. She will pretreat with cytotec 400mcg 3 hours prior to the procedure.  Oliver PilaKathy W Britt Theard 04/03/2014, 8:30 AM

## 2014-04-04 ENCOUNTER — Ambulatory Visit (HOSPITAL_COMMUNITY)
Admission: RE | Admit: 2014-04-04 | Discharge: 2014-04-04 | Disposition: A | Discharge status: Home / Self Care | Source: Ambulatory Visit | Attending: Obstetrics and Gynecology | Admitting: Obstetrics and Gynecology

## 2014-04-04 ENCOUNTER — Ambulatory Visit (HOSPITAL_COMMUNITY): Admitting: Anesthesiology

## 2014-04-04 ENCOUNTER — Encounter (HOSPITAL_COMMUNITY)
Admission: RE | Disposition: A | Discharge status: Home / Self Care | Payer: Self-pay | Source: Ambulatory Visit | Attending: Obstetrics and Gynecology

## 2014-04-04 ENCOUNTER — Encounter (HOSPITAL_COMMUNITY): Admitting: Anesthesiology

## 2014-04-04 ENCOUNTER — Encounter (HOSPITAL_COMMUNITY): Payer: Self-pay | Admitting: *Deleted

## 2014-04-04 DIAGNOSIS — O021 Missed abortion: Secondary | ICD-10-CM | POA: Diagnosis present

## 2014-04-04 DIAGNOSIS — Z87891 Personal history of nicotine dependence: Secondary | ICD-10-CM | POA: Diagnosis not present

## 2014-04-04 HISTORY — PX: DILATION AND CURETTAGE OF UTERUS: SHX78

## 2014-04-04 LAB — CBC
HCT: 38.7 % (ref 36.0–46.0)
HEMOGLOBIN: 13.4 g/dL (ref 12.0–15.0)
MCH: 30.9 pg (ref 26.0–34.0)
MCHC: 34.6 g/dL (ref 30.0–36.0)
MCV: 89.2 fL (ref 78.0–100.0)
Platelets: 188 10*3/uL (ref 150–400)
RBC: 4.34 MIL/uL (ref 3.87–5.11)
RDW: 12.4 % (ref 11.5–15.5)
WBC: 5.3 10*3/uL (ref 4.0–10.5)

## 2014-04-04 SURGERY — DILATION AND CURETTAGE
Anesthesia: Monitor Anesthesia Care | Site: Vagina

## 2014-04-04 MED ORDER — LIDOCAINE HCL 1 % IJ SOLN
INTRAMUSCULAR | Status: DC | PRN
  Administered 2014-04-04: 20 mL

## 2014-04-04 MED ORDER — ONDANSETRON HCL 4 MG/2ML IJ SOLN
INTRAMUSCULAR | Status: AC
  Filled 2014-04-04: qty 2

## 2014-04-04 MED ORDER — MIDAZOLAM HCL 2 MG/2ML IJ SOLN
INTRAMUSCULAR | Status: AC
  Filled 2014-04-04: qty 2

## 2014-04-04 MED ORDER — LIDOCAINE HCL 1 % IJ SOLN
INTRAMUSCULAR | Status: AC
  Filled 2014-04-04: qty 20

## 2014-04-04 MED ORDER — FENTANYL CITRATE 0.05 MG/ML IJ SOLN
INTRAMUSCULAR | Status: AC
  Filled 2014-04-04: qty 2

## 2014-04-04 MED ORDER — LACTATED RINGERS IV SOLN
INTRAVENOUS | Status: DC
  Administered 2014-04-04: 13:00:00 via INTRAVENOUS

## 2014-04-04 MED ORDER — LACTATED RINGERS IV SOLN
INTRAVENOUS | Status: DC
Start: 2014-04-04 — End: 2014-04-04

## 2014-04-04 MED ORDER — PROPOFOL 10 MG/ML IV BOLUS
INTRAVENOUS | Status: DC | PRN
  Administered 2014-04-04 (×3): 30 mg via INTRAVENOUS
  Administered 2014-04-04: 50 mg via INTRAVENOUS
  Administered 2014-04-04 (×2): 30 mg via INTRAVENOUS

## 2014-04-04 MED ORDER — FENTANYL CITRATE 0.05 MG/ML IJ SOLN
INTRAMUSCULAR | Status: DC | PRN
Start: 2014-04-04 — End: 2014-04-04
  Administered 2014-04-04 (×2): 50 ug via INTRAVENOUS

## 2014-04-04 MED ORDER — DEXAMETHASONE SODIUM PHOSPHATE 10 MG/ML IJ SOLN
INTRAMUSCULAR | Status: AC
  Filled 2014-04-04: qty 1

## 2014-04-04 MED ORDER — LIDOCAINE HCL (CARDIAC) 20 MG/ML IV SOLN
INTRAVENOUS | Status: DC | PRN
  Administered 2014-04-04: 80 mg via INTRAVENOUS

## 2014-04-04 MED ORDER — PROPOFOL 10 MG/ML IV EMUL
INTRAVENOUS | Status: AC
  Filled 2014-04-04: qty 20

## 2014-04-04 MED ORDER — MIDAZOLAM HCL 2 MG/2ML IJ SOLN
INTRAMUSCULAR | Status: DC | PRN
  Administered 2014-04-04: 2 mg via INTRAVENOUS

## 2014-04-04 MED ORDER — KETOROLAC TROMETHAMINE 30 MG/ML IJ SOLN
INTRAMUSCULAR | Status: AC
  Filled 2014-04-04: qty 1

## 2014-04-04 MED ORDER — LIDOCAINE HCL (CARDIAC) 20 MG/ML IV SOLN
INTRAVENOUS | Status: AC
  Filled 2014-04-04: qty 5

## 2014-04-04 MED ORDER — ONDANSETRON HCL 4 MG/2ML IJ SOLN
INTRAMUSCULAR | Status: DC | PRN
  Administered 2014-04-04: 4 mg via INTRAVENOUS

## 2014-04-04 MED ORDER — KETOROLAC TROMETHAMINE 30 MG/ML IJ SOLN
INTRAMUSCULAR | Status: DC | PRN
  Administered 2014-04-04: 30 mg via INTRAVENOUS

## 2014-04-04 SURGICAL SUPPLY — 14 items
CATH ROBINSON RED A/P 16FR (CATHETERS) ×3 IMPLANT
CLOTH BEACON ORANGE TIMEOUT ST (SAFETY) ×3 IMPLANT
CONTAINER PREFILL 10% NBF 60ML (FORM) ×6 IMPLANT
DRSG TELFA 3X8 NADH (GAUZE/BANDAGES/DRESSINGS) ×3 IMPLANT
GLOVE BIO SURGEON STRL SZ 6.5 (GLOVE) ×2 IMPLANT
GLOVE BIO SURGEONS STRL SZ 6.5 (GLOVE) ×1
GOWN STRL REUS W/TWL LRG LVL3 (GOWN DISPOSABLE) ×6 IMPLANT
NEEDLE SPNL 22GX3.5 QUINCKE BK (NEEDLE) ×3 IMPLANT
PACK VAGINAL MINOR WOMEN LF (CUSTOM PROCEDURE TRAY) ×3 IMPLANT
PAD OB MATERNITY 4.3X12.25 (PERSONAL CARE ITEMS) ×3 IMPLANT
PAD PREP 24X48 CUFFED NSTRL (MISCELLANEOUS) ×3 IMPLANT
SYR CONTROL 10ML LL (SYRINGE) ×3 IMPLANT
TOWEL OR 17X24 6PK STRL BLUE (TOWEL DISPOSABLE) ×6 IMPLANT
WATER STERILE IRR 1000ML POUR (IV SOLUTION) ×3 IMPLANT

## 2014-04-04 NOTE — Discharge Instructions (Signed)
DISCHARGE INSTRUCTIONS: D&C The following instructions have been prepared to help you care for yourself upon your return home.   **You may begin to take Ibuprofen after 8:45 pm tonight*  Personal hygiene:  Use sanitary pads for vaginal drainage, not tampons.  Shower the day after your procedure.  NO tub baths, pools or Jacuzzis for 2-3 weeks.  Wipe front to back after using the bathroom. Nothing in the vagina for 2 weeks  Activity and limitations:  Do NOT drive or operate any equipment for 24 hours. The effects of anesthesia are still present and drowsiness may result.  Do NOT rest in bed all day.  Walking is encouraged.  Walk up and down stairs slowly.  You may resume your normal activity in one to two days or as indicated by your physician.  Sexual activity: NO intercourse for at least 2 weeks after the procedure, or as indicated by your physician.  Diet: Eat a light meal as desired this evening. You may resume your usual diet tomorrow.  Return to work: You may resume your work activities in one to two days or as indicated by your doctor.  What to expect after your surgery: Expect to have vaginal bleeding/discharge for 2-3 days and spotting for up to 10 days. It is not unusual to have soreness for up to 1-2 weeks. You may have a slight burning sensation when you urinate for the first day. Mild cramps may continue for a couple of days. You may have a regular period in 2-6 weeks.  Call your doctor for any of the following:  Excessive vaginal bleeding, saturating and changing one pad every hour.  Inability to urinate 6 hours after discharge from hospital.  Pain not relieved by pain medication.  Fever of 100.4 F or greater.  Unusual vaginal discharge or odor.   Call for an appointment: to be seen by Dr Senaida Oresichardson in 2 weeks   Patients signature: ______________________  Nurses signature ________________________  Support person's  signature_______________________

## 2014-04-04 NOTE — OR Nursing (Signed)
Pt is latex sensitive. Latex free catheter and gloves used by OR staff

## 2014-04-04 NOTE — Op Note (Signed)
Operative note  Preoperative diagnosis Missed abortion at 339 weeks gestational age  Postoperative diagnosis Same  Procedure Suction D&E  Surgeon Dr. Huel CoteKathy Leotis Isham  Anesthesia LMA  Fluids Estimated blood loss 50 cc Urine output 50 cc straight catheter prior to procedure IV fluid 800 cc LR  Findings The uterus measured approximately [redacted] weeks gestational size and sounded to 7-8 cm. There were a moderate amount of products of conception obtained  Specimen Products of conception sent to pathology  Procedure note Patient was taken to the operating room where LMA anesthesia was obtained without difficulty. She was then prepped and draped in the normal sterile fashion in the dorsal lithotomy position. An appropriate time out was performed. Cervix was injected on the anterior lip with approximately 2 cc of 1% lidocaine plain and additional 9 cc each was placed at 2 and 10:00 for a paracervical block. The uterus was then easily sounded do to her prior Cytotec use. A size 7 suction curette was obtained. This was easily introduced into the uterine fundus and on several passes a moderate amount of products of conception were obtained. When no further tissue was forthcoming the suction was discontinued and a curettage performed of the uterine cavity. No additional tissue was noted. One further pass with the suction curet was performed and no active bleeding noted at were any further tissue obtained. At this point all instruments and sponges were removed from the vagina the cervix was closely inspected and found to be hemostatic at the tenaculum site. The patient was then awakened and taken to the recovery room in good condition.

## 2014-04-04 NOTE — Anesthesia Postprocedure Evaluation (Signed)
  Anesthesia Post-op Note  Anesthesia Post Note  Patient: [name]  Procedure(s) Performed: Procedure(s) (LRB): DILATATION AND CURETTAGE (N/A)  Anesthesia type: MAC  Patient location: PACU  Post pain: Pain level controlled  Post assessment: Post-op Vital signs reviewed  Last Vitals:  Filed Vitals:   04/04/14 1630  BP:   Pulse:   Temp: 37.1 C  Resp:     Post vital signs: Reviewed  Level of consciousness: sedated  Complications: No apparent anesthesia complications

## 2014-04-04 NOTE — Anesthesia Preprocedure Evaluation (Addendum)
Anesthesia Evaluation  Patient identified by MRN, date of birth, ID band Patient awake    Reviewed: Allergy & Precautions, H&P , NPO status , Patient's Chart, lab work & pertinent test results, reviewed documented beta blocker date and time   History of Anesthesia Complications Negative for: history of anesthetic complications  Airway Mallampati: I TM Distance: >3 FB Neck ROM: full    Dental  (+) Teeth Intact   Pulmonary neg pulmonary ROS, former smoker,  breath sounds clear to auscultation  Pulmonary exam normal       Cardiovascular Rhythm:regular Rate:Normal     Neuro/Psych Anxiety negative neurological ROS     GI/Hepatic negative GI ROS, Neg liver ROS,   Endo/Other  negative endocrine ROS  Renal/GU      Musculoskeletal   Abdominal   Peds  Hematology negative hematology ROS (+)   Anesthesia Other Findings Water at 12:30 pm - can start case after 1430  Reproductive/Obstetrics negative OB ROS (+) Pregnancy (missed ab at 7 weeeks)                         Anesthesia Physical Anesthesia Plan  ASA: II  Anesthesia Plan: MAC   Post-op Pain Management:    Induction:   Airway Management Planned:   Additional Equipment:   Intra-op Plan:   Post-operative Plan:   Informed Consent: I have reviewed the patients History and Physical, chart, labs and discussed the procedure including the risks, benefits and alternatives for the proposed anesthesia with the patient or authorized representative who has indicated his/her understanding and acceptance.   Dental Advisory Given  Plan Discussed with: CRNA and Surgeon  Anesthesia Plan Comments:         Anesthesia Quick Evaluation

## 2014-04-04 NOTE — Progress Notes (Signed)
Patient ID: [name], female   DOB: 16-Dec-1985, 28 y.o.   MRN: [id number] Per pt, no changes in dictated H&P.  Desires to proceed and brief exam WNL.

## 2014-04-04 NOTE — Transfer of Care (Signed)
Immediate Anesthesia Transfer of Care Note  Patient: [name]  Procedure(s) Performed: Procedure(s): DILATATION AND CURETTAGE (N/A)  Patient Location: PACU  Anesthesia Type:MAC  Level of Consciousness: awake, alert  and oriented  Airway & Oxygen Therapy: Patient Spontanous Breathing and Patient connected to nasal cannula oxygen  Post-op Assessment: Report given to PACU RN and Post -op Vital signs reviewed and stable  Post vital signs: Reviewed and stable  Complications: No apparent anesthesia complications

## 2014-04-05 ENCOUNTER — Encounter (HOSPITAL_COMMUNITY): Payer: Self-pay | Admitting: Obstetrics and Gynecology

## 2014-09-19 ENCOUNTER — Encounter (HOSPITAL_COMMUNITY): Payer: Self-pay | Admitting: Obstetrics and Gynecology

## 2014-11-18 DIAGNOSIS — G5600 Carpal tunnel syndrome, unspecified upper limb: Secondary | ICD-10-CM

## 2014-11-18 HISTORY — DX: Carpal tunnel syndrome, unspecified upper limb: G56.00

## 2015-03-23 ENCOUNTER — Ambulatory Visit (INDEPENDENT_AMBULATORY_CARE_PROVIDER_SITE_OTHER): Payer: TRICARE For Life (TFL) | Admitting: Neurology

## 2015-03-23 ENCOUNTER — Ambulatory Visit (INDEPENDENT_AMBULATORY_CARE_PROVIDER_SITE_OTHER): Payer: Self-pay | Admitting: Neurology

## 2015-03-23 DIAGNOSIS — G5601 Carpal tunnel syndrome, right upper limb: Secondary | ICD-10-CM | POA: Diagnosis not present

## 2015-03-24 ENCOUNTER — Encounter

## 2015-03-24 NOTE — Progress Notes (Signed)
See procedure note.

## 2015-03-24 NOTE — Progress Notes (Signed)
  [facility]    Provider:  Dr Lucia GaskinsAhern Referring Provider: SwazilandJordan, Betty G, MD Primary Care Physician:  Lenora BoysFRIED, ROBERT L, MD  HPI:  Dana BelfastKimberly T Hice is a 29 y.o. female here as a referral from Dr. SwazilandJordan for right hand pain.  Summary:   Right median APB motor nerve showed delayed distal onset latency(4.442ms, N<4.730ms) and normal F Wave latency Right 2nd-digit Median sensory nerve showed delayed distal peak latency(4.483ms, N<3.369ms)  Left Median APB motor nerve was within normal limits with normal F wave latency.  Left Median 2nd digit sensory nerve was within normal limits.   Bilateral Ulnar ADM motor nerves were within normal limits with normal F response latencies.  Bilateral Ulnar 5th digit sensory nerves were within normal limits.   Needle evaluation of the following muscles were within normal limits: right Deltoid, right Triceps, right Pronator teres, right Opponens Pollicis, right First Dorsal Interosseous. Patient declined emg of left arm and paraspinal muscles.   Conclusion: There electrophysiologic evidence for moderately-severe right Carpal Tunnel Syndrome. No suggestion of polyneuropathy or cervical radiculopathy.    Naomie DeanAntonia Ranessa Kosta, MD  [facility] [address] [place], [place] [id number]  Phone [phone] Fax [phone]

## 2015-03-24 NOTE — Procedures (Signed)
[facility]    Provider:  Dr Lucia GaskinsAhern Referring Provider: SwazilandJordan, Betty G, MD Primary Care Physician:  Lenora BoysFRIED, ROBERT L, MD  HPI:  [name] is a 29 y.o. female here as a referral from Dr. SwazilandJordan for right hand pain.  Summary:   Right median APB motor nerve showed delayed distal onset latency(4.692ms, N<4.710ms) and normal F Wave latency Right 2nd-digit Median sensory nerve showed delayed distal peak latency(4.583ms, N<3.129ms)  Left Median APB motor nerve was within normal limits with normal F wave latency.  Left Median 2nd digit sensory nerve was within normal limits.   Bilateral Ulnar ADM motor nerves were within normal limits with normal F response latencies.  Bilateral Ulnar 5th digit sensory nerves were within normal limits.   Needle evaluation of the following muscles were within normal limits: right Deltoid, right Triceps, right Pronator teres, right Opponens Pollicis, right First Dorsal Interosseous. Patient declined emg of left arm and paraspinal muscles.   Conclusion: There is electrophysiologic evidence for moderately-severe right Carpal Tunnel Syndrome. No suggestion of polyneuropathy or cervical radiculopathy.    Naomie DeanAntonia Ahern, MD  [facility] [address] [place], [place] [id number]  Phone [phone] Fax [phone]

## 2015-04-10 ENCOUNTER — Encounter: Payer: Self-pay | Admitting: Orthopedic Surgery

## 2015-11-19 DIAGNOSIS — L600 Ingrowing nail: Secondary | ICD-10-CM

## 2015-11-19 HISTORY — DX: Ingrowing nail: L60.0

## 2015-11-19 HISTORY — PX: NOSE SURGERY: SHX723

## 2018-11-18 HISTORY — PX: TONSILLECTOMY: SUR1361

## 2019-08-10 ENCOUNTER — Other Ambulatory Visit: Payer: Self-pay

## 2019-08-10 ENCOUNTER — Emergency Department (INDEPENDENT_AMBULATORY_CARE_PROVIDER_SITE_OTHER)
Admission: EM | Admit: 2019-08-10 | Discharge: 2019-08-10 | Disposition: A | Discharge status: Home / Self Care | Payer: Self-pay | Source: Home / Self Care | Attending: Emergency Medicine | Admitting: Emergency Medicine

## 2019-08-10 DIAGNOSIS — J029 Acute pharyngitis, unspecified: Secondary | ICD-10-CM

## 2019-08-10 DIAGNOSIS — J039 Acute tonsillitis, unspecified: Secondary | ICD-10-CM

## 2019-08-10 LAB — POCT MONO SCREEN (KUC): Mono, POC: NEGATIVE

## 2019-08-10 LAB — POCT RAPID STREP A (OFFICE): Rapid Strep A Screen: NEGATIVE

## 2019-08-10 MED ORDER — CEFDINIR 300 MG PO CAPS: 300.0000 mg | ORAL_CAPSULE | Freq: Two times a day (BID) | ORAL | 0 refills | Status: DC

## 2019-08-10 MED ORDER — CEFTRIAXONE SODIUM 1 G IJ SOLR
1.0000 g | INTRAMUSCULAR | Status: AC
  Administered 2019-08-10: 1 g via INTRAMUSCULAR

## 2019-08-10 MED ORDER — HYDROCODONE-ACETAMINOPHEN 5-325 MG PO TABS: 1.0000 | ORAL_TABLET | Freq: Four times a day (QID) | ORAL | 0 refills | Status: DC | PRN

## 2019-08-10 NOTE — Discharge Instructions (Addendum)
You have severe tonsillitis. Quick strep test negative.  We are sending off strep culture. Testing for mononucleosis also. Please read attached instruction sheet on tonsillitis. Treatment: Shot of Rocephin which is a strong antibiotic, now. Prescription sent to your pharmacy for strong antibiotic, called cefdinir.  Take twice a day for 10 days. Tylenol or ibuprofen for mild pain.  I sent prescription for Vicodin to your pharmacy if needed for severe pain. Follow-up this week with the ENT you saw Friday at [facility]. If any severe worsening symptoms, go to emergency room.

## 2019-08-10 NOTE — ED Provider Notes (Addendum)
[facility]    CSN: [id number] Arrival date & time: 08/10/19  1328      History   Chief Complaint Chief Complaint  Patient presents with  . Sore Throat    HPI [name] is a 33 y.o. female.   HPI 8 days of severe worsening sore throat.  Went to ED 9/16, was given oral penicillin, Medrol Dosepak, and given a shot of IM Decadron.  Still taking prescription for oral penicillin, 500 mg p.o. 3 times daily. no better. [facility] seen at ENT on 9/18.  Patient says that ENT did not do any testing or change treatment and advised her option of tonsillectomy, she states.  However tonsillectomy is not scheduled at this time. May have had low-grade fever, she is not sure. Complains of severe sore throat especially on the left, swollen L neck gland, trismus and pain in left ear. Denies nasal congestion.  No cardiorespiratory symptoms.  No cough.  No documented fever.  No myalgias or arthralgias.  No syncope or focal neurologic symptoms.  No rash. She states no testing was done at ED or ENT this past week.  She requests strep test today. She states it so hard to swallow, she is not able to eat and drink as much as she wants to.  She states she's lost 8-10 pounds in the past 8 days.  No abdominal pain, nausea, vomiting, diarrhea.  [facility] [place] [facility] [place].  Only regular medicine she takes is clonazepam, 1 mg at bedtime prn, averages 1 about twice a week.  Does not use daily.  She tried ibuprofen and Tylenol for the throat pain, and it is not helping significantly.  She quit smoking 2012. She absolutely denies any chance of pregnancy Past Medical History:  Diagnosis Date  . Abnormal Pap smear   . Anxiety   . History of HPV infection     There are no active problems to display for this patient.   Past Surgical History:  Procedure Laterality Date  . CESAREAN SECTION  09/03/2011   Procedure: CESAREAN SECTION;  Surgeon: Oliver PilaKathy W  Richardson;  Location: [facility];  Service: Gynecology;  Laterality: N/A;  . DILATION AND CURETTAGE OF UTERUS N/A 04/04/2014   Procedure: DILATATION AND CURETTAGE;  Surgeon: Oliver PilaKathy W Richardson, MD;  Location: [facility];  Service: Gynecology;  Laterality: N/A;  . LASER ABLATION OF THE CERVIX  2004  . WISDOM TOOTH EXTRACTION      OB History    Gravida  1   Para  1   Term  1   Preterm      AB      Living  1     SAB      TAB      Ectopic      Multiple      Live Births  1            Home Medications    Prior to Admission medications   Medication Sig Start Date End Date Taking? Authorizing Provider  acetaminophen (TYLENOL) 500 MG tablet Take 500 mg by mouth every 6 (six) hours as needed for mild pain.    [provider]  cefdinir (OMNICEF) 300 MG capsule Take 1 capsule (300 mg total) by mouth 2 (two) times daily. X 10 days 08/10/19   Lajean ManesMassey, David, MD  clonazePAM (KLONOPIN) 1 MG tablet Take 1 mg by mouth 2 (two) times daily as needed for anxiety.  [provider]  HYDROcodone-acetaminophen (NORCO/VICODIN) 5-325 MG tablet Take 1-2 tablets by mouth every 6 (six) hours as needed for severe pain. Take with food. 08/10/19   Lajean Manes, MD  ibuprofen (ADVIL,MOTRIN) 200 MG tablet Take 400 mg by mouth every 6 (six) hours as needed for mild pain.    [provider]    Family History Family History  Problem Relation Age of Onset  . Anesthesia problems Neg Hx   . Hypotension Neg Hx   . Malignant hyperthermia Neg Hx   . Pseudochol deficiency Neg Hx   . Diabetes Maternal Grandmother   . Hypertension Maternal Grandmother   . Diabetes Maternal Grandfather     Social History Social History   Tobacco Use  . Smoking status: Former Smoker    Types: Cigarettes    Quit date: 01/28/2011    Years since quitting: 8.5  . Smokeless tobacco: Never Used  Substance Use Topics  . Alcohol use: No  . Drug use: No     Allergies   Patient has no known  allergies.   Review of Systems Review of Systems  All other systems reviewed and are negative.    Physical Exam Triage Vital Signs ED Triage Vitals [08/10/19 1352]  Enc Vitals Group     BP 109/75     Pulse Rate 91     Resp 18     Temp 98.3 F (36.8 C)     Temp Source Oral     SpO2 98 %     Weight 105 lb (47.6 kg)     Height 5\' 6"  (1.676 m)     Head Circumference      Peak Flow      Pain Score 7     Pain Loc      Pain Edu?      Excl. in GC?    No data found.  Updated Vital Signs BP 109/75 (BP Location: Right Arm)   Pulse 91   Temp 98.3 F (36.8 C) (Oral)   Resp 18   Ht 5\' 6"  (1.676 m)   Wt 47.6 kg   SpO2 98%   BMI 16.95 kg/m    Physical Exam Vitals signs and nursing note reviewed.  Constitutional:      Appearance: She is ill-appearing. She is not toxic-appearing or diaphoretic.     Comments: Alert, cooperative.  She appears uncomfortable from throat pain.  Can ambulate without problem.  HENT:     Head: Normocephalic and atraumatic.     Right Ear: Tympanic membrane normal. No tenderness.     Left Ear: Tympanic membrane normal. No tenderness.     Nose: No congestion or rhinorrhea.     Mouth/Throat:     Mouth: Mucous membranes are moist. No oral lesions.     Pharynx: Uvula midline. Oropharyngeal exudate and posterior oropharyngeal erythema present. No uvula swelling.     Comments: 3+ enlarged tonsils with yellowish exudate, especially on the left.  Airway intact.  No bleeding or drainage.  No definite fluctuance. Eyes:     Conjunctiva/sclera: Conjunctivae normal.     Pupils: Pupils are equal, round, and reactive to light.  Neck:     Musculoskeletal: Neck supple.     Comments: Very tender 1 cm mobile rubbery enlarged left anterior cervical node.  Mildly tender, small right anterior cervical node. Cardiovascular:     Rate and Rhythm: Normal rate and regular rhythm.     Heart sounds: Normal heart sounds. No murmur.  Pulmonary:     Effort: Pulmonary effort  is normal.     Breath sounds: Normal breath sounds.  Abdominal:     Palpations: Abdomen is soft. There is no mass.     Tenderness: There is no abdominal tenderness.  Skin:    General: Skin is warm and dry.     Capillary Refill: Capillary refill takes less than 2 seconds.     Findings: No rash.  Neurological:     General: No focal deficit present.     Mental Status: She is alert.      UC Treatments / Results  Labs (all labs ordered are listed, but only abnormal results are displayed) Labs Reviewed  STREP A DNA PROBE  POCT RAPID STREP A (OFFICE)  POCT MONO SCREEN [facility])      Radiology No results found.  Procedures Procedures (including critical care time)  Medications Ordered in UC Medications  cefTRIAXone (ROCEPHIN) injection 1 g (1 g Intramuscular Given 08/10/19 1544)    Initial Impression / Assessment and Plan / UC Course  I have reviewed the triage vital signs and the nursing notes.  Pertinent labs & imaging results that were available during my care of the patient were reviewed by me and considered in my medical decision making (see chart for details).     POCT labs: Rapid strep test negative.  Monospot test negative. We are sending off strep DNA probe.  Discussed at length treatment options.  She has severe exudative tonsillitis, but airway is intact.  She does not have overt peritonsillar abscess on the left, but she is at risk of developing 1.   After 8 days of oral prednisone and Medrol Dosepak, she is not significantly improving. During the visit, she was able to call [facility]NT and arrange follow-up preop visit within 2 days there, likely will have tonsillectomy, and I agree with this plan. In the meantime, treat aggressively with Rocephin 1 g IM stat. Add Cefdinir 300 mg twice daily p.o. Small prescription for Vicodin, as needed severe pain. (Note, first prescriptions were sent to one pharmacy, then patient requested a different pharmacy, so we  immediately notified the first pharmacy by phone to cancel those prescriptions)  Precautions discussed. Red flags discussed.-Call [facility]NT immediately or go to ER if any red flag. Questions invited and answered. Patient voiced understanding and agreement.  Final Clinical Impressions(s) / UC Diagnoses   Final diagnoses:  Exudative tonsillitis  Acute pharyngitis, unspecified etiology     Discharge Instructions     You have severe tonsillitis. Quick strep test negative.  We are sending off strep culture. Testing for mononucleosis also. Please read attached instruction sheet on tonsillitis. Treatment: Shot of Rocephin which is a strong antibiotic, now. Prescription sent to your pharmacy for strong antibiotic, called cefdinir.  Take twice a day for 10 days. Tylenol or ibuprofen for mild pain.  I sent prescription for Vicodin to your pharmacy if needed for severe pain. Follow-up this week with the ENT you saw Friday at [facility]. If any severe worsening symptoms, go to emergency room.     ED Prescriptions    Medication Sig Dispense Auth. Provider   cefdinir (OMNICEF) 300 MG capsule  (Status: Discontinued) Take 1 capsule (300 mg total) by mouth 2 (two) times daily. X 10 days 20 capsule Jacqulyn Cane, MD   HYDROcodone-acetaminophen (NORCO/VICODIN) 5-325 MG tablet  (Status: Discontinued) Take 1-2 tablets by mouth every 6 (six) hours as needed for severe pain. Take with food. 12  tablet Lajean Manes, MD   cefdinir (OMNICEF) 300 MG capsule Take 1 capsule (300 mg total) by mouth 2 (two) times daily. X 10 days 20 capsule Lajean Manes, MD   HYDROcodone-acetaminophen (NORCO/VICODIN) 5-325 MG tablet Take 1-2 tablets by mouth every 6 (six) hours as needed for severe pain. Take with food. 12 tablet Lajean Manes, MD     I have reviewed the PDMP during this encounter.   Lajean Manes, MD 08/11/19 1915  Addendum: Strep DNA probe came back negative.    Lajean Manes, MD 08/11/19  [phone]

## 2019-08-10 NOTE — ED Triage Notes (Signed)
Pt c/o strep throat x 1 week. Went to ED on 9/16. Was given PCN po and Decadron IM. Was referred to ENT on 9/18 who she says did not do anything for her. Still has extremely sore throat and pain in LT ear and neck.

## 2019-08-11 LAB — STREP A DNA PROBE: Group A Strep Probe: NOT DETECTED

## 2019-08-19 DIAGNOSIS — T50902A Poisoning by unspecified drugs, medicaments and biological substances, intentional self-harm, initial encounter: Secondary | ICD-10-CM | POA: Insufficient documentation

## 2019-08-19 HISTORY — DX: Poisoning by unspecified drugs, medicaments and biological substances, intentional self-harm, initial encounter: T50.902A

## 2019-09-17 DIAGNOSIS — F1220 Cannabis dependence, uncomplicated: Secondary | ICD-10-CM | POA: Insufficient documentation

## 2019-09-17 DIAGNOSIS — F1410 Cocaine abuse, uncomplicated: Secondary | ICD-10-CM

## 2019-09-17 HISTORY — DX: Cocaine abuse, uncomplicated: F14.10

## 2019-09-17 HISTORY — DX: Cannabis dependence, uncomplicated: F12.20

## 2019-09-18 DIAGNOSIS — F1990 Other psychoactive substance use, unspecified, uncomplicated: Secondary | ICD-10-CM | POA: Insufficient documentation

## 2019-09-18 HISTORY — DX: Other psychoactive substance use, unspecified, uncomplicated: F19.90

## 2020-03-06 ENCOUNTER — Other Ambulatory Visit: Payer: Self-pay

## 2020-03-10 ENCOUNTER — Encounter: Payer: Self-pay | Admitting: Family Medicine

## 2020-03-10 ENCOUNTER — Other Ambulatory Visit: Payer: Self-pay

## 2020-03-10 ENCOUNTER — Ambulatory Visit: Payer: Self-pay | Admitting: Family Medicine

## 2020-03-10 VITALS — BP 117/70 | HR 72 | Temp 98.8°F | Resp 16 | Ht 66.75 in | Wt 125.0 lb

## 2020-03-10 DIAGNOSIS — F4310 Post-traumatic stress disorder, unspecified: Secondary | ICD-10-CM

## 2020-03-10 DIAGNOSIS — T50902A Poisoning by unspecified drugs, medicaments and biological substances, intentional self-harm, initial encounter: Secondary | ICD-10-CM

## 2020-03-10 DIAGNOSIS — F1990 Other psychoactive substance use, unspecified, uncomplicated: Secondary | ICD-10-CM

## 2020-03-10 DIAGNOSIS — F319 Bipolar disorder, unspecified: Secondary | ICD-10-CM

## 2020-03-10 DIAGNOSIS — F4010 Social phobia, unspecified: Secondary | ICD-10-CM

## 2020-03-10 MED ORDER — QUETIAPINE FUMARATE 200 MG PO TABS: 200.0000 mg | ORAL_TABLET | Freq: Every day | ORAL | 2 refills | Status: DC

## 2020-03-10 MED ORDER — HYDROXYZINE HCL 10 MG PO TABS: 10.0000 mg | ORAL_TABLET | Freq: Three times a day (TID) | ORAL | 1 refills | Status: DC | PRN

## 2020-03-10 MED ORDER — GABAPENTIN 300 MG PO CAPS: 300.0000 mg | ORAL_CAPSULE | Freq: Two times a day (BID) | ORAL | 2 refills | Status: DC

## 2020-03-10 NOTE — Patient Instructions (Addendum)
Call [place] today and get an appt asap.   [facility]  [address] [place], [place] [address] Phone: [phone]  Fax: [phone]  [facility]- Walk In services are available in the event that outpatient psychiatric care is needed prior to your scheduled appointment Walk-In Clinic: Monday thru Friday from 8:00am - 3:00pm)  As long as doing ok, we will see you back in 3 mos.  medication refills provided today on seroquel and gabapentin. New med: vistaril - 1-2 tabs about 30 minutes before social event/leaving house.  Can take med up to every 8 hours if needed.    Depression Screening Depression screening is a tool that your health care provider can use to learn if you have symptoms of depression. Depression is a common condition with many symptoms that are also often found in other conditions. Depression is treatable, but it must first be diagnosed. You may not know that certain feelings, thoughts, and behaviors that you are having can be symptoms of depression. Taking a depression screening test can help you and your health care provider decide if you need more assessment, or if you should be referred to a mental health care provider. What are the screening tests?  You may have a physical exam to see if another condition is affecting your mental health. You may have a blood or urine sample taken during the physical exam.  You may be interviewed using a screening tool that was developed from research, such as one of these: ? Patient Health Questionnaire (PHQ). This is a set of either 2 or 9 questions. A health care provider who has been trained to score this screening test uses a guide to assess if your symptoms suggest that you may have depression. ? Hamilton Depression Rating Scale (HAM-D). This is a set of either 17 or 24 questions. You may be asked to take it again during or after your treatment, to see if your depression has gotten better. ? Beck Depression  Inventory (BDI). This is a set of 21 multiple choice questions. Your health care provider scores your answers to assess:  Your level of depression, ranging from mild to severe.  Your response to treatment.  Your health care provider may talk with you about your daily activities, such as eating, sleeping, work, and recreation, and ask if you have had any changes in activity.  Your health care provider may ask you to see a mental health specialist, such as a psychiatrist or psychologist, for more evaluation. Who should be screened for depression?   All adults, including adults with a family history of a mental health disorder.  Adolescents who are 59-24 years old.  People who are recovering from a myocardial infarction (MI).  Pregnant women, or women who have given birth.  People who have a long-term (chronic) illness.  Anyone who has been diagnosed with another type of a mental health disorder.  Anyone who has symptoms that could show depression. What do my results mean? Your health care provider will review the results of your depression screening, physical exam, and lab tests. Positive screens suggest that you may have depression. Screening is the first step in getting the care that you may need. It is up to you to get your screening results. Ask your health care provider, or the department that is doing your screening tests, when your results will be ready. Talk with your health care provider about your results and diagnosis. A diagnosis of depression is made using the Diagnostic  and Statistical Manual of Mental Disorders (DSM-V). This is a book that lists the number and type of symptoms that must be present for a health care provider to give a specific diagnosis.  Your health care provider may work with you to treat your symptoms of depression, or your health care provider may help you find a mental health provider who can assess, diagnose, and treat your depression. Get help right  away if:  You have thoughts about hurting yourself or others. If you ever feel like you may hurt yourself or others, or have thoughts about taking your own life, get help right away. You can go to your nearest emergency department or call:  Your local emergency services (911 in the [place]  A suicide crisis helpline, such as the [facility] at 934-499-8031. This is open 24 hours a day. Summary  Depression screening is the first step in getting the help that you may need.  If your screening test shows symptoms of depression (is positive), your health care provider may ask you to see a mental health provider.  Anyone who is age 65 or older should be screened for depression. This information is not intended to replace advice given to you by your health care provider. Make sure you discuss any questions you have with your health care provider. Document Revised: 10/17/2017 Document Reviewed: 03/21/2017 Elsevier Patient Education  [place].
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
## 2020-03-13 ENCOUNTER — Encounter: Payer: Self-pay | Admitting: Family Medicine

## 2020-03-13 DIAGNOSIS — F4010 Social phobia, unspecified: Secondary | ICD-10-CM | POA: Insufficient documentation

## 2020-03-13 DIAGNOSIS — F319 Bipolar disorder, unspecified: Secondary | ICD-10-CM | POA: Insufficient documentation

## 2020-03-13 DIAGNOSIS — F4310 Post-traumatic stress disorder, unspecified: Secondary | ICD-10-CM | POA: Insufficient documentation

## 2020-05-12 ENCOUNTER — Telehealth: Payer: Self-pay

## 2020-05-12 NOTE — Telephone Encounter (Signed)
Patient called in stating that Dr told er to contact [facility] for her aniexty  medication, she has been playing "phone tag' with them since she left her last OV. She is wanting to know if there is somewhere else she can be referred to, to receive her medication. Also patient states the medication she prescribed to her is not working   Medication: Ativan

## 2020-05-12 NOTE — Telephone Encounter (Signed)
Pt has done the consultation and scheduled appt early June with [facility] for med management but nobody ever called her at her appt time. She states she has called around 17 times and they just keep saying they will send a message back and someone will call her back. Pt states the meds she was given by Dr Claiborne Billings are not working for her anxiety and is not sure why she has to go to Psych to get anxiety meds. She got these from her prior pcp for 16 years. I told her that depending on the level of care that is needed and is based of each pts individuals needs, they may need to go to Psych for med management. I suggested she walk in during the walk in hours and try to see someone or try [facility]. [facility] [place] does not write RX's and only does counseling. Pt will try to walk in early next week but states the meds are not working for her. She will return call with an update or if unable to get anywhere with walking in. Pt has been given Crisis hotline #

## 2020-06-09 ENCOUNTER — Other Ambulatory Visit: Payer: Self-pay

## 2020-06-09 ENCOUNTER — Ambulatory Visit: Payer: Self-pay | Admitting: Family Medicine

## 2020-06-09 VITALS — BP 98/62 | HR 77 | Temp 98.6°F | Ht 66.75 in | Wt 122.0 lb

## 2020-06-09 DIAGNOSIS — T50902A Poisoning by unspecified drugs, medicaments and biological substances, intentional self-harm, initial encounter: Secondary | ICD-10-CM

## 2020-06-09 DIAGNOSIS — F4310 Post-traumatic stress disorder, unspecified: Secondary | ICD-10-CM

## 2020-06-09 DIAGNOSIS — F1990 Other psychoactive substance use, unspecified, uncomplicated: Secondary | ICD-10-CM

## 2020-06-09 DIAGNOSIS — F319 Bipolar disorder, unspecified: Secondary | ICD-10-CM

## 2020-06-09 DIAGNOSIS — F4010 Social phobia, unspecified: Secondary | ICD-10-CM

## 2020-06-09 MED ORDER — GABAPENTIN 300 MG PO CAPS: 300.0000 mg | ORAL_CAPSULE | Freq: Two times a day (BID) | ORAL | 5 refills | Status: DC

## 2020-06-09 MED ORDER — QUETIAPINE FUMARATE 50 MG PO TABS: 50.0000 mg | ORAL_TABLET | Freq: Two times a day (BID) | ORAL | 5 refills | Status: DC

## 2020-06-09 MED ORDER — QUETIAPINE FUMARATE 200 MG PO TABS: 200.0000 mg | ORAL_TABLET | Freq: Every day | ORAL | 5 refills | Status: DC

## 2020-06-09 NOTE — Patient Instructions (Signed)
vistaril every 8 hours - supplied by psychiatry team.  Continue gabapentin every 12 hours.  Continue Seroquel 200 mg before bed.  Start Seroquel 50 mg at lunch. If needed can take 50 mg tab in the morning and late afternoon- while continuing the Seroquel 200 mg before bed. (no more than 300 mg of Seroquel a day).   5.5 months follow up in you continue with [facility]. If not continuing with [facility] follow up with me in 3 mos (before running out of medication)   Generalized Anxiety Disorder, Adult Generalized anxiety disorder (GAD) is a mental health disorder. People with this condition constantly worry about everyday events. Unlike normal anxiety, worry related to GAD is not triggered by a specific event. These worries also do not fade or get better with time. GAD interferes with life functions, including relationships, work, and school. GAD can vary from mild to severe. People with severe GAD can have intense waves of anxiety with physical symptoms (panic attacks). What are the causes? The exact cause of GAD is not known. What increases the risk? This condition is more likely to develop in:  Women.  People who have a family history of anxiety disorders.  People who are very shy.  People who experience very stressful life events, such as the death of a loved one.  People who have a very stressful family environment. What are the signs or symptoms? People with GAD often worry excessively about many things in their lives, such as their health and family. They may also be overly concerned about:  Doing well at work.  Being on time.  Natural disasters.  Friendships. Physical symptoms of GAD include:  Fatigue.  Muscle tension or having muscle twitches.  Trembling or feeling shaky.  Being easily startled.  Feeling like your heart is pounding or racing.  Feeling out of breath or like you cannot take a deep breath.  Having trouble falling asleep or staying  asleep.  Sweating.  Nausea, diarrhea, or irritable bowel syndrome (IBS).  Headaches.  Trouble concentrating or remembering facts.  Restlessness.  Irritability. How is this diagnosed? Your health care provider can diagnose GAD based on your symptoms and medical history. You will also have a physical exam. The health care provider will ask specific questions about your symptoms, including how severe they are, when they started, and if they come and go. Your health care provider may ask you about your use of alcohol or drugs, including prescription medicines. Your health care provider may refer you to a mental health specialist for further evaluation. Your health care provider will do a thorough examination and may perform additional tests to rule out other possible causes of your symptoms. To be diagnosed with GAD, a person must have anxiety that:  Is out of his or her control.  Affects several different aspects of his or her life, such as work and relationships.  Causes distress that makes him or her unable to take part in normal activities.  Includes at least three physical symptoms of GAD, such as restlessness, fatigue, trouble concentrating, irritability, muscle tension, or sleep problems. Before your health care provider can confirm a diagnosis of GAD, these symptoms must be present more days than they are not, and they must last for six months or longer. How is this treated? The following therapies are usually used to treat GAD:  Medicine. Antidepressant medicine is usually prescribed for long-term daily control. Antianxiety medicines may be added in severe cases, especially when panic attacks occur.  Talk therapy (psychotherapy). Certain types of talk therapy can be helpful in treating GAD by providing support, education, and guidance. Options include: ? Cognitive behavioral therapy (CBT). People learn coping skills and techniques to ease their anxiety. They learn to identify  unrealistic or negative thoughts and behaviors and to replace them with positive ones. ? Acceptance and commitment therapy (ACT). This treatment teaches people how to be mindful as a way to cope with unwanted thoughts and feelings. ? Biofeedback. This process trains you to manage your body's response (physiological response) through breathing techniques and relaxation methods. You will work with a therapist while machines are used to monitor your physical symptoms.  Stress management techniques. These include yoga, meditation, and exercise. A mental health specialist can help determine which treatment is best for you. Some people see improvement with one type of therapy. However, other people require a combination of therapies. Follow these instructions at home:  Take over-the-counter and prescription medicines only as told by your health care provider.  Try to maintain a normal routine.  Try to anticipate stressful situations and allow extra time to manage them.  Practice any stress management or self-calming techniques as taught by your health care provider.  Do not punish yourself for setbacks or for not making progress.  Try to recognize your accomplishments, even if they are small.  Keep all follow-up visits as told by your health care provider. This is important. Contact a health care provider if:  Your symptoms do not get better.  Your symptoms get worse.  You have signs of depression, such as: ? A persistently sad, cranky, or irritable mood. ? Loss of enjoyment in activities that used to bring you joy. ? Change in weight or eating. ? Changes in sleeping habits. ? Avoiding friends or family members. ? Loss of energy for normal tasks. ? Feelings of guilt or worthlessness. Get help right away if:  You have serious thoughts about hurting yourself or others. If you ever feel like you may hurt yourself or others, or have thoughts about taking your own life, get help right  away. You can go to your nearest emergency department or call:  Your local emergency services (911 in the [place]  A suicide crisis helpline, such as the National Suicide Prevention Lifeline at 682-425-5476. This is open 24 hours a day. Summary  Generalized anxiety disorder (GAD) is a mental health disorder that involves worry that is not triggered by a specific event.  People with GAD often worry excessively about many things in their lives, such as their health and family.  GAD may cause physical symptoms such as restlessness, trouble concentrating, sleep problems, frequent sweating, nausea, diarrhea, headaches, and trembling or muscle twitching.  A mental health specialist can help determine which treatment is best for you. Some people see improvement with one type of therapy. However, other people require a combination of therapies. This information is not intended to replace advice given to you by your health care provider. Make sure you discuss any questions you have with your health care provider. Document Revised: 10/17/2017 Document Reviewed: 09/24/2016 Elsevier Patient Education  2020 [employer].
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
## 2020-08-09 ENCOUNTER — Encounter: Payer: Self-pay | Admitting: Family Medicine

## 2020-08-09 ENCOUNTER — Other Ambulatory Visit: Payer: Self-pay

## 2020-08-09 ENCOUNTER — Ambulatory Visit (INDEPENDENT_AMBULATORY_CARE_PROVIDER_SITE_OTHER): Payer: Self-pay | Admitting: Family Medicine

## 2020-08-09 VITALS — BP 115/73 | HR 92 | Temp 99.0°F | Ht 66.0 in | Wt 129.0 lb

## 2020-08-09 DIAGNOSIS — F4010 Social phobia, unspecified: Secondary | ICD-10-CM

## 2020-08-09 DIAGNOSIS — F1990 Other psychoactive substance use, unspecified, uncomplicated: Secondary | ICD-10-CM

## 2020-08-09 DIAGNOSIS — F319 Bipolar disorder, unspecified: Secondary | ICD-10-CM

## 2020-08-09 DIAGNOSIS — T50902A Poisoning by unspecified drugs, medicaments and biological substances, intentional self-harm, initial encounter: Secondary | ICD-10-CM

## 2020-08-09 DIAGNOSIS — F4310 Post-traumatic stress disorder, unspecified: Secondary | ICD-10-CM

## 2020-08-09 MED ORDER — GABAPENTIN 300 MG PO CAPS: 300.0000 mg | ORAL_CAPSULE | Freq: Two times a day (BID) | ORAL | 5 refills | Status: DC

## 2020-08-09 MED ORDER — BUSPIRONE HCL 10 MG PO TABS: 10.0000 mg | ORAL_TABLET | Freq: Three times a day (TID) | ORAL | 5 refills | Status: DC

## 2020-08-09 MED ORDER — QUETIAPINE FUMARATE 200 MG PO TABS
200.0000 mg | ORAL_TABLET | Freq: Every day | ORAL | 5 refills | Status: DC
Start: 2020-08-09 — End: 2021-06-27
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
## 2020-08-09 NOTE — Patient Instructions (Signed)
Continue seroquel before bed.  Continue gabapentin twice a day.  Start buspar 1 tab in the morning for 3 days, then 1 tab morning and late afternoon for 3 days, then 1 tab every 8 hours  Follow up in 3 mos if needed, 6 months if regimen is working well.

## 2020-09-11 ENCOUNTER — Telehealth: Payer: Self-pay | Admitting: Family Medicine

## 2020-09-11 NOTE — Telephone Encounter (Signed)
Patient states she is having issue with anus (did not want to elaborate) and wants to speak with a nurse or Dr. Claiborne Billings about it.

## 2020-09-11 NOTE — Telephone Encounter (Signed)
Called pt back and pt stated that she is concerned because when she has a BM she says that she has bloody stool and when she is finishes she hurts for a few hours. She says that it burns when she goes also. She does not really want to come in for a visit but she will if she has to. Please advise.

## 2020-09-11 NOTE — Telephone Encounter (Signed)
Please schedule patient for an appointment for evaluation.  I would recommend an in person visit so that we can perform an exam for a more accurate diagnosis.

## 2020-09-11 NOTE — Telephone Encounter (Signed)
LVM for pt to CB regarding prior message to make an appt

## 2020-09-18 ENCOUNTER — Telehealth: Payer: Self-pay | Admitting: Family Medicine

## 2020-09-18 NOTE — Telephone Encounter (Signed)
Patient states Dr. Claiborne Billings wanted to know if the buspirone current dose of 10mg  tid was not helping enough. Patient would like to have the dose increased. Please call patient to advise.

## 2020-09-19 MED ORDER — BUSPIRONE HCL 15 MG PO TABS
15.0000 mg | ORAL_TABLET | Freq: Three times a day (TID) | ORAL | 5 refills | Status: DC
Start: 2020-09-19 — End: 2021-02-19

## 2020-09-19 NOTE — Telephone Encounter (Signed)
Discontinued prior buspar script. New script with be 15 mg tid.

## 2020-09-19 NOTE — Telephone Encounter (Signed)
Please advise if pt is needing to have ov for medication dosage increase.

## 2020-09-19 NOTE — Telephone Encounter (Signed)
LVM for call back

## 2020-09-22 NOTE — Telephone Encounter (Signed)
Returning call from Tues 11/2  Please call patient at [phone] .  Thank you

## 2020-10-09 ENCOUNTER — Encounter: Payer: Self-pay | Admitting: Family Medicine

## 2020-10-09 ENCOUNTER — Other Ambulatory Visit: Payer: Self-pay

## 2020-10-09 ENCOUNTER — Ambulatory Visit: Payer: Self-pay | Admitting: Family Medicine

## 2020-10-09 VITALS — BP 134/80 | HR 89 | Temp 98.4°F | Ht 66.0 in | Wt 127.0 lb

## 2020-10-09 DIAGNOSIS — F1990 Other psychoactive substance use, unspecified, uncomplicated: Secondary | ICD-10-CM

## 2020-10-09 DIAGNOSIS — F4010 Social phobia, unspecified: Secondary | ICD-10-CM

## 2020-10-09 DIAGNOSIS — F319 Bipolar disorder, unspecified: Secondary | ICD-10-CM

## 2020-10-09 DIAGNOSIS — F4310 Post-traumatic stress disorder, unspecified: Secondary | ICD-10-CM

## 2020-10-09 MED ORDER — DULOXETINE HCL 30 MG PO CPEP: 30.0000 mg | ORAL_CAPSULE | Freq: Every day | ORAL | 5 refills | Status: DC
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
## 2020-10-09 NOTE — Patient Instructions (Signed)
Added on Cymbalta 30 mg a day in the morning. Continue seroquel, buspar and gabapentin.   Keep follow up in about 4 months- unless needed sooner.

## 2020-12-21 ENCOUNTER — Telehealth: Payer: Self-pay | Admitting: Family Medicine

## 2020-12-21 MED ORDER — GABAPENTIN 300 MG PO CAPS: 300.0000 mg | ORAL_CAPSULE | Freq: Two times a day (BID) | ORAL | 5 refills | Status: DC

## 2020-12-21 NOTE — Telephone Encounter (Signed)
Patient requesting refill of gabapentin. Please send to same [facility] in [place].

## 2021-01-17 ENCOUNTER — Ambulatory Visit: Payer: Self-pay | Admitting: Family Medicine

## 2021-01-22 ENCOUNTER — Other Ambulatory Visit: Payer: Self-pay

## 2021-01-23 ENCOUNTER — Encounter: Payer: Self-pay | Admitting: Family Medicine

## 2021-01-23 ENCOUNTER — Ambulatory Visit (INDEPENDENT_AMBULATORY_CARE_PROVIDER_SITE_OTHER): Payer: Self-pay | Admitting: Family Medicine

## 2021-01-23 VITALS — BP 117/66 | HR 89 | Temp 98.0°F | Ht 66.0 in | Wt 121.0 lb

## 2021-01-23 DIAGNOSIS — K648 Other hemorrhoids: Secondary | ICD-10-CM

## 2021-01-23 DIAGNOSIS — K625 Hemorrhage of anus and rectum: Secondary | ICD-10-CM

## 2021-01-23 DIAGNOSIS — G5600 Carpal tunnel syndrome, unspecified upper limb: Secondary | ICD-10-CM | POA: Insufficient documentation

## 2021-01-23 MED ORDER — HYDROCORTISONE ACETATE 25 MG RE SUPP: 25.0000 mg | Freq: Two times a day (BID) | RECTAL | 0 refills | Status: DC

## 2021-01-23 NOTE — Patient Instructions (Signed)
1/2-1 cap full of miralax in 8 ounces of water a  Day.  Epson salt and warm bath soaks are helpful as well.  If continues would need to consider referral to gastroenterology.   Suppository once a day for 3-4 days, then stop.     Anal Fissure, Adult  An anal fissure is a small tear or crack in the tissue around the opening of the butt (anus). Bleeding from the tear or crack usually stops on its own within a few minutes. The bleeding may happen every time you poop (have a bowel movement) until the tear or crack heals. What are the causes? This condition is usually caused by passing a large or hard poop (stool). Other causes include:  Trouble pooping (constipation).  Passing watery poop (diarrhea).  Inflammatory bowel disease (Crohn's disease or ulcerative colitis).  Childbirth.  Infections.  Anal sex. What are the signs or symptoms? Symptoms of this condition include:  Bleeding from the butt.  Small amounts of blood on your poop. The blood coats the outside of the poop. It is not mixed with the poop.  Small amounts of blood on the toilet paper or in the toilet after you poop.  Pain when passing poop.  Itching or irritation around the opening of the butt. How is this diagnosed? This condition may be diagnosed based on a physical exam. Your doctor may:  Check your butt. A tear can often be seen by checking the area with care.  Check your butt using a short tube (anoscope). The light in the tube will show any problems in your butt. How is this treated? Treatment for this condition may include:  Treating problems that make it hard for you to pass poop. You may be told to: ? Eat more fiber. ? Drink more fluid. ? Take fiber supplements. ? Take medicines that make poop soft.  Taking sitz baths. This may help to heal the tear.  Using creams and ointments. If your condition gets worse, other treatments may be needed such as:  A shot near the tear or crack (botulinum  injection).  Surgery to repair the tear or crack. Follow these instructions at home: Eating and drinking  Avoid bananas and dairy products. These foods can make it hard to poop.  Drink enough fluid to keep your pee (urine) pale yellow.  Eat foods that have a lot of fiber in them, such as: ? Beans. ? Whole grains. ? Fresh fruits. ? Fresh vegetables.   General instructions  Take over-the-counter and prescription medicines only as told by your doctor.  Use creams or ointments only as told by your doctor.  Keep the butt area as clean and dry as you can.  Take a warm water bath (sitz bath) as told by your doctor. Do not use soap.  Keep all follow-up visits as told by your doctor. This is important.   Contact a doctor if:  You have more bleeding.  You have a fever.  You have watery poop that is mixed with blood.  You have pain.  Your problem gets worse, not better. Summary  An anal fissure is a small tear or crack in the skin around the opening of the butt (anus).  This condition is usually caused by passing a large or hard poop (stool).  Treatment includes treating the problems that make it hard for you to pass poop.  Follow your doctor's instructions about caring for your condition at home.  Keep all follow-up visits as told by  your doctor. This is important. This information is not intended to replace advice given to you by your health care provider. Make sure you discuss any questions you have with your health care provider. Document Revised: 04/16/2018 Document Reviewed: 04/16/2018 Elsevier Patient Education  2021 [employer].
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
## 2021-02-19 ENCOUNTER — Other Ambulatory Visit: Payer: Self-pay

## 2021-02-19 MED ORDER — BUSPIRONE HCL 15 MG PO TABS: 15.0000 mg | ORAL_TABLET | Freq: Three times a day (TID) | ORAL | 0 refills | Status: DC

## 2021-04-25 ENCOUNTER — Other Ambulatory Visit: Payer: Self-pay

## 2021-04-25 MED ORDER — DULOXETINE HCL 30 MG PO CPEP
30.0000 mg | ORAL_CAPSULE | Freq: Every day | ORAL | 0 refills | Status: DC
Start: 2021-04-25 — End: 2021-08-08

## 2021-05-23 ENCOUNTER — Other Ambulatory Visit: Payer: Self-pay | Admitting: Family Medicine

## 2021-05-28 ENCOUNTER — Other Ambulatory Visit: Payer: Self-pay | Admitting: Family Medicine

## 2021-05-31 ENCOUNTER — Other Ambulatory Visit: Payer: Self-pay | Admitting: Family Medicine

## 2021-06-27 ENCOUNTER — Other Ambulatory Visit: Payer: Self-pay

## 2021-06-27 MED ORDER — QUETIAPINE FUMARATE 200 MG PO TABS: 200.0000 mg | ORAL_TABLET | Freq: Every day | ORAL | 0 refills | Status: DC

## 2021-06-27 MED ORDER — GABAPENTIN 300 MG PO CAPS: 300.0000 mg | ORAL_CAPSULE | Freq: Two times a day (BID) | ORAL | 0 refills | Status: DC

## 2021-07-25 ENCOUNTER — Other Ambulatory Visit: Payer: Self-pay | Admitting: Family Medicine

## 2021-07-26 NOTE — Telephone Encounter (Signed)
Needs OV for refills.  

## 2021-07-28 ENCOUNTER — Telehealth: Payer: Self-pay | Admitting: Family Medicine

## 2021-08-02 NOTE — Telephone Encounter (Signed)
Patient following up on refill request.  She said that she called in several and this is the only one that got denied.  [name]  QUEtiapine (SEROQUEL) 200 MG tablet [[id number]]

## 2021-08-02 NOTE — Telephone Encounter (Signed)
Please assist pt with scheduling appt. Pt can receive grace period once appt is schedule.   Note to Pharmacy: Needs OV for refills

## 2021-08-03 MED ORDER — QUETIAPINE FUMARATE 200 MG PO TABS: 200.0000 mg | ORAL_TABLET | Freq: Every day | ORAL | 0 refills | Status: DC

## 2021-08-03 NOTE — Telephone Encounter (Signed)
Patient called in stating that she needs this medication sent in today and explained to someone yesterday that she will call to schedule an appointment once she has her work schedule.

## 2021-08-03 NOTE — Telephone Encounter (Signed)
Refilled for 14 days.  This is her last grace period.  No further extensions.  Must be seen before any refills.

## 2021-08-03 NOTE — Telephone Encounter (Signed)
Noted  

## 2021-08-03 NOTE — Addendum Note (Signed)
Addended by: Felix Pacini A on: 08/03/2021 12:46 PM   Modules accepted: Orders

## 2021-08-03 NOTE — Telephone Encounter (Signed)
LVM for pt to CB to schedule and appt.   Routing to PCP as a [place]

## 2021-08-03 NOTE — Telephone Encounter (Signed)
Patient is scheduled for an appointment next Wednesday, [name] would like for the medication to be sent in today.

## 2021-08-08 ENCOUNTER — Ambulatory Visit (INDEPENDENT_AMBULATORY_CARE_PROVIDER_SITE_OTHER): Payer: Self-pay | Admitting: Family Medicine

## 2021-08-08 ENCOUNTER — Encounter: Payer: Self-pay | Admitting: Family Medicine

## 2021-08-08 ENCOUNTER — Other Ambulatory Visit: Payer: Self-pay

## 2021-08-08 VITALS — BP 101/64 | HR 84 | Temp 98.1°F | Ht 66.0 in | Wt 121.0 lb

## 2021-08-08 DIAGNOSIS — F1990 Other psychoactive substance use, unspecified, uncomplicated: Secondary | ICD-10-CM

## 2021-08-08 DIAGNOSIS — F3170 Bipolar disorder, currently in remission, most recent episode unspecified: Secondary | ICD-10-CM

## 2021-08-08 DIAGNOSIS — T50902A Poisoning by unspecified drugs, medicaments and biological substances, intentional self-harm, initial encounter: Secondary | ICD-10-CM

## 2021-08-08 DIAGNOSIS — F4010 Social phobia, unspecified: Secondary | ICD-10-CM

## 2021-08-08 DIAGNOSIS — Z2821 Immunization not carried out because of patient refusal: Secondary | ICD-10-CM

## 2021-08-08 DIAGNOSIS — F4310 Post-traumatic stress disorder, unspecified: Secondary | ICD-10-CM

## 2021-08-08 MED ORDER — BUSPIRONE HCL 15 MG PO TABS: 15.0000 mg | ORAL_TABLET | Freq: Three times a day (TID) | ORAL | 1 refills | Status: DC

## 2021-08-08 MED ORDER — ARIPIPRAZOLE 2 MG PO TABS: 2.0000 mg | ORAL_TABLET | Freq: Every day | ORAL | 1 refills | Status: DC

## 2021-08-08 MED ORDER — QUETIAPINE FUMARATE 200 MG PO TABS: 200.0000 mg | ORAL_TABLET | Freq: Every day | ORAL | 1 refills | Status: DC

## 2021-08-08 MED ORDER — GABAPENTIN 300 MG PO CAPS: 300.0000 mg | ORAL_CAPSULE | Freq: Two times a day (BID) | ORAL | 1 refills | Status: DC

## 2021-08-08 NOTE — Patient Instructions (Signed)
  Great to see you today.  I have refilled the medication(s) we provide.   If labs were collected, we will inform you of lab results once received either by echart message or telephone call.   - echart message- for normal results that have been seen by the patient already.   - telephone call: abnormal results or if patient has not viewed results in their echart.   Start Abilify in the day.
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
## 2022-01-18 ENCOUNTER — Other Ambulatory Visit: Payer: Self-pay

## 2022-01-18 ENCOUNTER — Encounter: Payer: Self-pay | Admitting: Family Medicine

## 2022-01-18 ENCOUNTER — Ambulatory Visit (INDEPENDENT_AMBULATORY_CARE_PROVIDER_SITE_OTHER): Payer: Self-pay | Admitting: Family Medicine

## 2022-01-18 VITALS — BP 101/64 | HR 79 | Temp 98.4°F | Ht 66.0 in | Wt 122.0 lb

## 2022-01-18 DIAGNOSIS — F4010 Social phobia, unspecified: Secondary | ICD-10-CM

## 2022-01-18 DIAGNOSIS — F3170 Bipolar disorder, currently in remission, most recent episode unspecified: Secondary | ICD-10-CM

## 2022-01-18 DIAGNOSIS — F4310 Post-traumatic stress disorder, unspecified: Secondary | ICD-10-CM

## 2022-01-18 MED ORDER — BUSPIRONE HCL 15 MG PO TABS: 15.0000 mg | ORAL_TABLET | Freq: Three times a day (TID) | ORAL | 1 refills | Status: DC

## 2022-01-18 MED ORDER — QUETIAPINE FUMARATE 200 MG PO TABS: 200.0000 mg | ORAL_TABLET | Freq: Every day | ORAL | 1 refills | Status: DC

## 2022-01-18 MED ORDER — GABAPENTIN 300 MG PO CAPS: 300.0000 mg | ORAL_CAPSULE | Freq: Two times a day (BID) | ORAL | 1 refills | Status: DC
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
## 2022-01-18 NOTE — Patient Instructions (Addendum)
?  Glad your are doing well.  ?Hope have a great week with your son.  ? ?

## 2022-02-09 ENCOUNTER — Other Ambulatory Visit: Payer: Self-pay | Admitting: Family Medicine

## 2022-07-05 ENCOUNTER — Ambulatory Visit: Payer: Self-pay | Admitting: Family Medicine

## 2022-07-15 ENCOUNTER — Ambulatory Visit (INDEPENDENT_AMBULATORY_CARE_PROVIDER_SITE_OTHER): Payer: Self-pay | Admitting: Family Medicine

## 2022-07-15 ENCOUNTER — Encounter: Payer: Self-pay | Admitting: Family Medicine

## 2022-07-15 VITALS — BP 105/63 | HR 84 | Temp 97.9°F | Ht 66.0 in | Wt 116.0 lb

## 2022-07-15 DIAGNOSIS — F4310 Post-traumatic stress disorder, unspecified: Secondary | ICD-10-CM

## 2022-07-15 DIAGNOSIS — F3170 Bipolar disorder, currently in remission, most recent episode unspecified: Secondary | ICD-10-CM

## 2022-07-15 DIAGNOSIS — F4010 Social phobia, unspecified: Secondary | ICD-10-CM

## 2022-07-15 MED ORDER — QUETIAPINE FUMARATE 200 MG PO TABS: 200.0000 mg | ORAL_TABLET | Freq: Every day | ORAL | 1 refills | Status: DC

## 2022-07-15 MED ORDER — GABAPENTIN 300 MG PO CAPS: 300.0000 mg | ORAL_CAPSULE | Freq: Two times a day (BID) | ORAL | 1 refills | Status: DC

## 2022-07-15 MED ORDER — BUSPIRONE HCL 15 MG PO TABS: 15.0000 mg | ORAL_TABLET | Freq: Three times a day (TID) | ORAL | 1 refills | Status: DC

## 2022-07-15 NOTE — Patient Instructions (Addendum)
Return in about 24 weeks (around 12/30/2022).        Great to see you today.  I have refilled the medication(s) we provide.   If labs were collected, we will inform you of lab results once received either by echart message or telephone call.   - echart message- for normal results that have been seen by the patient already.   - telephone call: abnormal results or if patient has not viewed results in their echart.
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
## 2022-11-06 ENCOUNTER — Other Ambulatory Visit: Payer: Self-pay | Admitting: Family Medicine

## 2022-12-30 ENCOUNTER — Encounter: Payer: Self-pay | Admitting: Family Medicine

## 2022-12-30 ENCOUNTER — Telehealth (INDEPENDENT_AMBULATORY_CARE_PROVIDER_SITE_OTHER): Payer: Self-pay | Admitting: Family Medicine

## 2022-12-30 DIAGNOSIS — J111 Influenza due to unidentified influenza virus with other respiratory manifestations: Secondary | ICD-10-CM

## 2022-12-30 DIAGNOSIS — R051 Acute cough: Secondary | ICD-10-CM

## 2022-12-30 MED ORDER — HYDROXYZINE PAMOATE 50 MG PO CAPS: 50.0000 mg | ORAL_CAPSULE | Freq: Three times a day (TID) | ORAL | 0 refills | Status: DC | PRN

## 2022-12-30 MED ORDER — PREDNISONE 20 MG PO TABS: ORAL_TABLET | ORAL | 0 refills | Status: DC

## 2022-12-30 MED ORDER — AZITHROMYCIN 250 MG PO TABS: ORAL_TABLET | ORAL | 0 refills | Status: AC

## 2022-12-30 NOTE — Patient Instructions (Signed)
No follow-ups on file.        Great to see you today.  I have refilled the medication(s) we provide.   If labs were collected, we will inform you of lab results once received either by echart message or telephone call.   - echart message- for normal results that have been seen by the patient already.   - telephone call: abnormal results or if patient has not viewed results in their echart.  
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
## 2023-01-30 ENCOUNTER — Other Ambulatory Visit: Payer: Self-pay | Admitting: Family Medicine

## 2023-02-10 ENCOUNTER — Encounter: Payer: Self-pay | Admitting: Family Medicine

## 2023-02-10 ENCOUNTER — Ambulatory Visit (INDEPENDENT_AMBULATORY_CARE_PROVIDER_SITE_OTHER): Payer: Self-pay | Admitting: Family Medicine

## 2023-02-10 VITALS — BP 100/63 | HR 80 | Temp 98.1°F | Wt 123.0 lb

## 2023-02-10 DIAGNOSIS — F4310 Post-traumatic stress disorder, unspecified: Secondary | ICD-10-CM

## 2023-02-10 DIAGNOSIS — F4010 Social phobia, unspecified: Secondary | ICD-10-CM

## 2023-02-10 DIAGNOSIS — F3170 Bipolar disorder, currently in remission, most recent episode unspecified: Secondary | ICD-10-CM

## 2023-02-10 MED ORDER — QUETIAPINE FUMARATE 200 MG PO TABS: 200.0000 mg | ORAL_TABLET | Freq: Every day | ORAL | 1 refills | Status: DC

## 2023-02-10 MED ORDER — GABAPENTIN 300 MG PO CAPS: 300.0000 mg | ORAL_CAPSULE | Freq: Two times a day (BID) | ORAL | 1 refills | Status: DC

## 2023-02-10 MED ORDER — BUSPIRONE HCL 15 MG PO TABS: 15.0000 mg | ORAL_TABLET | Freq: Three times a day (TID) | ORAL | 1 refills | Status: DC

## 2023-02-10 NOTE — Patient Instructions (Addendum)
Return in about 24 weeks (around 07/28/2023).        Great to see you today.  I have refilled the medication(s) we provide.   If labs were collected, we will inform you of lab results once received either by echart message or telephone call.   - echart message- for normal results that have been seen by the patient already.   - telephone call: abnormal results or if patient has not viewed results in their echart.
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
## 2023-03-09 ENCOUNTER — Other Ambulatory Visit: Payer: Self-pay | Admitting: Family Medicine

## 2023-07-03 ENCOUNTER — Encounter (INDEPENDENT_AMBULATORY_CARE_PROVIDER_SITE_OTHER): Payer: Self-pay

## 2023-07-28 ENCOUNTER — Encounter: Payer: Self-pay | Admitting: Family Medicine

## 2023-07-28 ENCOUNTER — Ambulatory Visit (INDEPENDENT_AMBULATORY_CARE_PROVIDER_SITE_OTHER): Payer: Self-pay | Admitting: Family Medicine

## 2023-07-28 VITALS — BP 113/74 | HR 88 | Temp 98.6°F | Wt 133.6 lb

## 2023-07-28 DIAGNOSIS — T50902A Poisoning by unspecified drugs, medicaments and biological substances, intentional self-harm, initial encounter: Secondary | ICD-10-CM

## 2023-07-28 DIAGNOSIS — F4010 Social phobia, unspecified: Secondary | ICD-10-CM

## 2023-07-28 DIAGNOSIS — F4310 Post-traumatic stress disorder, unspecified: Secondary | ICD-10-CM

## 2023-07-28 DIAGNOSIS — F3170 Bipolar disorder, currently in remission, most recent episode unspecified: Secondary | ICD-10-CM

## 2023-07-28 MED ORDER — BUSPIRONE HCL 15 MG PO TABS: 15.0000 mg | ORAL_TABLET | Freq: Two times a day (BID) | ORAL | 1 refills | Status: DC

## 2023-07-28 MED ORDER — GABAPENTIN 300 MG PO CAPS: 300.0000 mg | ORAL_CAPSULE | Freq: Every day | ORAL | 1 refills | Status: DC

## 2023-07-28 MED ORDER — QUETIAPINE FUMARATE 200 MG PO TABS: 200.0000 mg | ORAL_TABLET | Freq: Every day | ORAL | 1 refills | Status: DC

## 2023-07-28 NOTE — Patient Instructions (Signed)
Return in about 24 weeks (around 01/12/2024).        Great to see you today.  I have refilled the medication(s) we provide.   If labs were collected or images ordered, we will inform you of  results once we have received them and reviewed. We will contact you either by echart message, or telephone call.  Please give ample time to the testing facility, and our office to run,  receive and review results. Please do not call inquiring of results, even if you can see them in your chart. We will contact you as soon as we are able. If it has been over 1 week since the test was completed, and you have not yet heard from [place], then please call us.    - echart message- for normal results that have been seen by the patient already.   - telephone call: abnormal results or if patient has not viewed results in their echart.  If a referral to a specialist was entered for you, please call us in 2 weeks if you have not heard from the specialist office to schedule.
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
## 2023-09-06 ENCOUNTER — Other Ambulatory Visit: Payer: Self-pay | Admitting: Family Medicine

## 2024-01-12 ENCOUNTER — Other Ambulatory Visit: Payer: Self-pay | Admitting: Family Medicine

## 2024-01-12 ENCOUNTER — Ambulatory Visit (INDEPENDENT_AMBULATORY_CARE_PROVIDER_SITE_OTHER): Payer: Medicaid Other | Admitting: Family Medicine

## 2024-01-12 ENCOUNTER — Encounter: Payer: Self-pay | Admitting: Family Medicine

## 2024-01-12 VITALS — BP 108/74 | HR 86 | Temp 98.2°F | Wt 134.8 lb

## 2024-01-12 DIAGNOSIS — F4310 Post-traumatic stress disorder, unspecified: Secondary | ICD-10-CM

## 2024-01-12 DIAGNOSIS — F4010 Social phobia, unspecified: Secondary | ICD-10-CM

## 2024-01-12 DIAGNOSIS — F3170 Bipolar disorder, currently in remission, most recent episode unspecified: Secondary | ICD-10-CM

## 2024-01-12 DIAGNOSIS — T50902A Poisoning by unspecified drugs, medicaments and biological substances, intentional self-harm, initial encounter: Secondary | ICD-10-CM

## 2024-01-12 MED ORDER — GABAPENTIN 300 MG PO CAPS: 300.0000 mg | ORAL_CAPSULE | Freq: Every day | ORAL | 1 refills | Status: DC

## 2024-01-12 MED ORDER — QUETIAPINE FUMARATE 200 MG PO TABS: 200.0000 mg | ORAL_TABLET | Freq: Every day | ORAL | 1 refills | Status: DC
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
## 2024-01-12 NOTE — Patient Instructions (Addendum)
 Return in about 24 weeks (around 06/28/2024).        Great to see you today.  I have refilled the medication(s) we provide.   If labs were collected or images ordered, we will inform you of  results once we have received them and reviewed. We will contact you either by echart message, or telephone call.  Please give ample time to the testing facility, and our office to run,  receive and review results. Please do not call inquiring of results, even if you can see them in your chart. We will contact you as soon as we are able. If it has been over 1 week since the test was completed, and you have not yet heard from [place], then please call us.    - echart message- for normal results that have been seen by the patient already.   - telephone call: abnormal results or if patient has not viewed results in their echart.  If a referral to a specialist was entered for you, please call us in 2 weeks if you have not heard from the specialist office to schedule.

## 2024-06-28 ENCOUNTER — Ambulatory Visit: Payer: Medicaid Other | Admitting: Family Medicine

## 2024-07-05 ENCOUNTER — Ambulatory Visit: Admitting: Family Medicine

## 2024-07-05 ENCOUNTER — Encounter: Payer: Self-pay | Admitting: Family Medicine

## 2024-07-05 VITALS — BP 106/70 | HR 90 | Temp 98.3°F | Wt 135.6 lb

## 2024-07-05 DIAGNOSIS — F4310 Post-traumatic stress disorder, unspecified: Secondary | ICD-10-CM

## 2024-07-05 DIAGNOSIS — F4010 Social phobia, unspecified: Secondary | ICD-10-CM

## 2024-07-05 DIAGNOSIS — F3170 Bipolar disorder, currently in remission, most recent episode unspecified: Secondary | ICD-10-CM

## 2024-07-05 MED ORDER — QUETIAPINE FUMARATE 100 MG PO TABS: 100.0000 mg | ORAL_TABLET | Freq: Every day | ORAL | 1 refills | Status: DC

## 2024-07-05 NOTE — Patient Instructions (Signed)
 Return in about 24 weeks (around 12/20/2024) for Routine chronic condition follow-up.        Great to see you today.  I have refilled the medication(s) we provide.   If labs were collected or images ordered, we will inform you of  results once we have received them and reviewed. We will contact you either by echart message, or telephone call.  Please give ample time to the testing facility, and our office to run,  receive and review results. Please do not call inquiring of results, even if you can see them in your chart. We will contact you as soon as we are able. If it has been over 1 week since the test was completed, and you have not yet heard from us , then please call us .    - echart message- for normal results that have been seen by the patient already.   - telephone call: abnormal results or if patient has not viewed results in their echart.  If a referral to a specialist was entered for you, please call us  in 2 weeks if you have not heard from the specialist office to schedule.
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
## 2024-07-17 ENCOUNTER — Other Ambulatory Visit: Payer: Self-pay | Admitting: Family Medicine

## 2024-12-20 ENCOUNTER — Ambulatory Visit: Admitting: Family Medicine

## 2024-12-21 ENCOUNTER — Ambulatory Visit: Admitting: Family Medicine

## 2024-12-21 ENCOUNTER — Encounter: Payer: Self-pay | Admitting: Family Medicine

## 2024-12-21 VITALS — BP 108/70 | HR 89 | Temp 98.1°F | Wt 128.0 lb

## 2024-12-21 DIAGNOSIS — F4010 Social phobia, unspecified: Secondary | ICD-10-CM

## 2024-12-21 DIAGNOSIS — F4310 Post-traumatic stress disorder, unspecified: Secondary | ICD-10-CM

## 2024-12-21 DIAGNOSIS — F3170 Bipolar disorder, currently in remission, most recent episode unspecified: Secondary | ICD-10-CM

## 2024-12-21 MED ORDER — QUETIAPINE FUMARATE 50 MG PO TABS: 50.0000 mg | ORAL_TABLET | Freq: Every day | ORAL | 1 refills | Status: AC

## 2025-06-06 ENCOUNTER — Ambulatory Visit: Admitting: Family Medicine
